# Patient Record
Sex: Male | Born: 1945 | Race: Black or African American | Hispanic: No | Marital: Married | Smoking: Never smoker
Health system: Southern US, Community
[De-identification: ages and names within clinical notes are randomized; demographics above are authoritative.]

## PROBLEM LIST (undated history)

## (undated) DIAGNOSIS — T7840XA Allergy, unspecified, initial encounter: Secondary | ICD-10-CM

## (undated) DIAGNOSIS — C61 Malignant neoplasm of prostate: Secondary | ICD-10-CM

## (undated) DIAGNOSIS — Z973 Presence of spectacles and contact lenses: Secondary | ICD-10-CM

## (undated) DIAGNOSIS — E785 Hyperlipidemia, unspecified: Secondary | ICD-10-CM

## (undated) DIAGNOSIS — I10 Essential (primary) hypertension: Secondary | ICD-10-CM

## (undated) DIAGNOSIS — E119 Type 2 diabetes mellitus without complications: Secondary | ICD-10-CM

## (undated) DIAGNOSIS — Z8614 Personal history of Methicillin resistant Staphylococcus aureus infection: Secondary | ICD-10-CM

## (undated) HISTORY — PX: PROSTATE BIOPSY: SHX241

## (undated) HISTORY — DX: Allergy, unspecified, initial encounter: T78.40XA

---

## 1969-03-14 HISTORY — PX: APPENDECTOMY: SHX54

## 1979-03-15 HISTORY — PX: ABDOMINAL EXPLORATION SURGERY: SHX538

## 2002-07-14 HISTORY — PX: COLONOSCOPY: SHX174

## 2002-07-25 ENCOUNTER — Ambulatory Visit (HOSPITAL_COMMUNITY): Admission: RE | Admit: 2002-07-25 | Discharge: 2002-07-25 | Payer: Self-pay | Admitting: Internal Medicine

## 2003-03-26 ENCOUNTER — Inpatient Hospital Stay (HOSPITAL_COMMUNITY): Admission: EM | Admit: 2003-03-26 | Discharge: 2003-03-29 | Payer: Self-pay | Admitting: Emergency Medicine

## 2004-05-10 ENCOUNTER — Emergency Department (HOSPITAL_COMMUNITY): Admission: EM | Admit: 2004-05-10 | Discharge: 2004-05-10 | Payer: Self-pay | Admitting: Emergency Medicine

## 2004-05-27 ENCOUNTER — Ambulatory Visit (HOSPITAL_COMMUNITY): Admission: RE | Admit: 2004-05-27 | Discharge: 2004-05-27 | Payer: Self-pay | Admitting: Pulmonary Disease

## 2004-05-30 ENCOUNTER — Ambulatory Visit: Payer: Self-pay | Admitting: Orthopedic Surgery

## 2004-07-04 ENCOUNTER — Ambulatory Visit: Payer: Self-pay | Admitting: Orthopedic Surgery

## 2005-04-03 ENCOUNTER — Ambulatory Visit: Payer: Self-pay | Admitting: Orthopedic Surgery
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
## 2012-05-27 DIAGNOSIS — E119 Type 2 diabetes mellitus without complications: Secondary | ICD-10-CM | POA: Diagnosis not present

## 2012-05-27 DIAGNOSIS — E785 Hyperlipidemia, unspecified: Secondary | ICD-10-CM | POA: Diagnosis not present

## 2012-05-27 DIAGNOSIS — Z125 Encounter for screening for malignant neoplasm of prostate: Secondary | ICD-10-CM | POA: Diagnosis not present

## 2012-05-27 DIAGNOSIS — I10 Essential (primary) hypertension: Secondary | ICD-10-CM | POA: Diagnosis not present

## 2012-06-02 DIAGNOSIS — I10 Essential (primary) hypertension: Secondary | ICD-10-CM | POA: Diagnosis not present

## 2012-06-02 DIAGNOSIS — Z0000 Encounter for general adult medical examination without abnormal findings: Secondary | ICD-10-CM | POA: Diagnosis not present

## 2012-06-02 DIAGNOSIS — E785 Hyperlipidemia, unspecified: Secondary | ICD-10-CM | POA: Diagnosis not present

## 2012-06-04 DIAGNOSIS — Z1212 Encounter for screening for malignant neoplasm of rectum: Secondary | ICD-10-CM | POA: Diagnosis not present

## 2012-09-28 DIAGNOSIS — E119 Type 2 diabetes mellitus without complications: Secondary | ICD-10-CM | POA: Diagnosis not present

## 2012-09-28 DIAGNOSIS — J301 Allergic rhinitis due to pollen: Secondary | ICD-10-CM | POA: Diagnosis not present

## 2012-09-28 DIAGNOSIS — E559 Vitamin D deficiency, unspecified: Secondary | ICD-10-CM | POA: Diagnosis not present

## 2012-09-28 DIAGNOSIS — I10 Essential (primary) hypertension: Secondary | ICD-10-CM | POA: Diagnosis not present

## 2012-09-28 DIAGNOSIS — E785 Hyperlipidemia, unspecified: Secondary | ICD-10-CM | POA: Diagnosis not present

## 2012-09-28 DIAGNOSIS — F4321 Adjustment disorder with depressed mood: Secondary | ICD-10-CM | POA: Diagnosis not present

## 2013-03-28 DIAGNOSIS — E1129 Type 2 diabetes mellitus with other diabetic kidney complication: Secondary | ICD-10-CM | POA: Diagnosis not present

## 2013-03-28 DIAGNOSIS — I10 Essential (primary) hypertension: Secondary | ICD-10-CM | POA: Diagnosis not present

## 2013-03-28 DIAGNOSIS — Z6825 Body mass index (BMI) 25.0-25.9, adult: Secondary | ICD-10-CM | POA: Diagnosis not present

## 2013-03-28 DIAGNOSIS — F4321 Adjustment disorder with depressed mood: Secondary | ICD-10-CM | POA: Diagnosis not present

## 2013-03-28 DIAGNOSIS — E785 Hyperlipidemia, unspecified: Secondary | ICD-10-CM | POA: Diagnosis not present

## 2013-03-28 DIAGNOSIS — Z23 Encounter for immunization: Secondary | ICD-10-CM | POA: Diagnosis not present

## 2013-07-21 DIAGNOSIS — E559 Vitamin D deficiency, unspecified: Secondary | ICD-10-CM | POA: Diagnosis not present

## 2013-07-21 DIAGNOSIS — E1169 Type 2 diabetes mellitus with other specified complication: Secondary | ICD-10-CM | POA: Diagnosis not present

## 2013-07-21 DIAGNOSIS — E785 Hyperlipidemia, unspecified: Secondary | ICD-10-CM | POA: Diagnosis not present

## 2013-07-21 DIAGNOSIS — Z125 Encounter for screening for malignant neoplasm of prostate: Secondary | ICD-10-CM | POA: Diagnosis not present

## 2013-07-28 DIAGNOSIS — R972 Elevated prostate specific antigen [PSA]: Secondary | ICD-10-CM | POA: Diagnosis not present

## 2013-07-28 DIAGNOSIS — Z0000 Encounter for general adult medical examination without abnormal findings: Secondary | ICD-10-CM | POA: Diagnosis not present

## 2013-07-28 DIAGNOSIS — E785 Hyperlipidemia, unspecified: Secondary | ICD-10-CM | POA: Diagnosis not present

## 2013-07-28 DIAGNOSIS — I10 Essential (primary) hypertension: Secondary | ICD-10-CM | POA: Diagnosis not present

## 2013-07-28 DIAGNOSIS — E1129 Type 2 diabetes mellitus with other diabetic kidney complication: Secondary | ICD-10-CM | POA: Diagnosis not present

## 2013-07-28 DIAGNOSIS — Z23 Encounter for immunization: Secondary | ICD-10-CM | POA: Diagnosis not present

## 2013-07-28 DIAGNOSIS — N183 Chronic kidney disease, stage 3 unspecified: Secondary | ICD-10-CM | POA: Diagnosis not present

## 2013-07-28 DIAGNOSIS — E559 Vitamin D deficiency, unspecified: Secondary | ICD-10-CM | POA: Diagnosis not present

## 2013-07-28 DIAGNOSIS — Z125 Encounter for screening for malignant neoplasm of prostate: Secondary | ICD-10-CM | POA: Diagnosis not present

## 2013-07-28 DIAGNOSIS — Z1331 Encounter for screening for depression: Secondary | ICD-10-CM | POA: Diagnosis not present

## 2013-08-09 DIAGNOSIS — Z1212 Encounter for screening for malignant neoplasm of rectum: Secondary | ICD-10-CM | POA: Diagnosis not present

## 2013-10-27 DIAGNOSIS — IMO0001 Reserved for inherently not codable concepts without codable children: Secondary | ICD-10-CM | POA: Diagnosis not present

## 2013-10-27 DIAGNOSIS — E785 Hyperlipidemia, unspecified: Secondary | ICD-10-CM | POA: Diagnosis not present

## 2013-10-27 DIAGNOSIS — Z6825 Body mass index (BMI) 25.0-25.9, adult: Secondary | ICD-10-CM | POA: Diagnosis not present

## 2013-10-27 DIAGNOSIS — I10 Essential (primary) hypertension: Secondary | ICD-10-CM | POA: Diagnosis not present

## 2013-10-27 DIAGNOSIS — R972 Elevated prostate specific antigen [PSA]: Secondary | ICD-10-CM | POA: Diagnosis not present

## 2013-10-27 DIAGNOSIS — E559 Vitamin D deficiency, unspecified: Secondary | ICD-10-CM | POA: Diagnosis not present

## 2013-10-27 DIAGNOSIS — N183 Chronic kidney disease, stage 3 unspecified: Secondary | ICD-10-CM | POA: Diagnosis not present

## 2013-11-22 ENCOUNTER — Ambulatory Visit (INDEPENDENT_AMBULATORY_CARE_PROVIDER_SITE_OTHER): Payer: Medicare Other | Admitting: Urology

## 2013-11-22 DIAGNOSIS — R972 Elevated prostate specific antigen [PSA]: Secondary | ICD-10-CM

## 2013-11-25 ENCOUNTER — Other Ambulatory Visit: Payer: Self-pay | Admitting: Urology

## 2013-11-25 DIAGNOSIS — R972 Elevated prostate specific antigen [PSA]: Secondary | ICD-10-CM

## 2013-12-20 ENCOUNTER — Ambulatory Visit (HOSPITAL_COMMUNITY)
Admission: RE | Admit: 2013-12-20 | Discharge: 2013-12-20 | Disposition: A | Discharge status: Home / Self Care | Payer: Medicare Other | Source: Ambulatory Visit | Attending: Urology | Admitting: Urology

## 2013-12-20 ENCOUNTER — Encounter (HOSPITAL_COMMUNITY): Payer: Self-pay

## 2013-12-20 ENCOUNTER — Other Ambulatory Visit: Payer: Self-pay | Admitting: Urology

## 2013-12-20 VITALS — BP 140/83 | HR 95 | Resp 16

## 2013-12-20 DIAGNOSIS — R972 Elevated prostate specific antigen [PSA]: Secondary | ICD-10-CM | POA: Diagnosis not present

## 2013-12-20 DIAGNOSIS — D075 Carcinoma in situ of prostate: Secondary | ICD-10-CM | POA: Diagnosis not present

## 2013-12-20 DIAGNOSIS — C61 Malignant neoplasm of prostate: Secondary | ICD-10-CM | POA: Diagnosis not present

## 2013-12-20 MED ORDER — LIDOCAINE HCL (PF) 2 % IJ SOLN
10.0000 mL | Freq: Once | INTRAMUSCULAR | Status: AC
  Administered 2013-12-20: 10 mL

## 2013-12-20 MED ORDER — GENTAMICIN SULFATE 40 MG/ML IJ SOLN
INTRAMUSCULAR | Status: AC
  Filled 2013-12-20: qty 4

## 2013-12-20 MED ORDER — LIDOCAINE HCL (PF) 2 % IJ SOLN
INTRAMUSCULAR | Status: AC
  Administered 2013-12-20: 10 mL
  Filled 2013-12-20: qty 10

## 2013-12-20 MED ORDER — GENTAMICIN SULFATE 40 MG/ML IJ SOLN
160.0000 mg | Freq: Once | INTRAMUSCULAR | Status: AC
  Administered 2013-12-20: 160 mg via INTRAMUSCULAR
  Filled 2013-12-20: qty 4

## 2013-12-20 NOTE — Progress Notes (Signed)
Prostate biopsy complete no signs of distress.  

## 2013-12-20 NOTE — Discharge Instructions (Signed)
Prostate Biopsy TRUS Biopsy BEFORE THE TEST   Do not take aspirin. Do not take any medicine that has aspirin in it 7 days before your biopsy.  You may be given a medicine to take on the day of your biopsy.  You may also be given a medicine or treatment to help you go poop (laxative or enema). AFTER THE TEST  Only take medicine as told by your doctor.  It is normal to have some bleeding from your rectum for the first 5 days.  You may have blood in your pee (urine) or sperm. Finding out the results of your test Ask when your test results will be ready. Make sure you get your test results. GET HELP RIGHT AWAY IF:  You have a temperature by mouth above 102 F (38.9 C), not controlled by medicine.  You have blood in your pee for more than 5 days.  You have a lot of blood in your pee.  You have bleeding from your rectum for more than 5 days or have a lot of blood in your poop (feces).  You have severe pain. Document Released: 06/18/2009 Document Revised: 09/22/2011 Document Reviewed: 02/16/2013 [facility] Patient Information 2014 [place], [place].

## 2013-12-27 ENCOUNTER — Encounter: Payer: Self-pay | Admitting: Urology

## 2014-01-23 DIAGNOSIS — C61 Malignant neoplasm of prostate: Secondary | ICD-10-CM | POA: Diagnosis not present

## 2014-02-07 ENCOUNTER — Encounter: Payer: Self-pay | Admitting: Radiation Oncology

## 2014-02-07 NOTE — Progress Notes (Signed)
GU Location of Tumor / Histology: prostate  If Prostate Cancer, Gleason Score is (3 + 3) and PSA is (7.68 on 11/22/13) 07/21/13 PSA 4.717  Miguel Powers presented 1 months ago with signs/symptoms of:   Biopsies of  prostate(if applicable) revealed:  0000000  Volume 30 mL    Past/Anticipated interventions by urology, if any: PSA surveillance  Past/Anticipated interventions by medical oncology, if any: no  Weight changes, if any: no  Bowel/Bladder complaints, if any:  IPSS 9, frequency, nocturia x 4  Nausea/Vomiting, if any: no  Pain issues, if any:  no  SAFETY ISSUES:  Prior radiation? no  Pacemaker/ICD? no  Possible current pregnancy? na  Is the patient on methotrexate? 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

## 2014-02-09 ENCOUNTER — Ambulatory Visit
Admission: RE | Admit: 2014-02-09 | Discharge: 2014-02-09 | Disposition: A | Discharge status: Home / Self Care | Payer: Medicare Other | Source: Ambulatory Visit | Attending: Radiation Oncology | Admitting: Radiation Oncology

## 2014-02-09 ENCOUNTER — Encounter: Payer: Self-pay | Admitting: Radiation Oncology

## 2014-02-09 VITALS — BP 129/68 | HR 102 | Temp 98.3°F | Resp 20 | Ht 71.0 in | Wt 190.4 lb

## 2014-02-09 DIAGNOSIS — N529 Male erectile dysfunction, unspecified: Secondary | ICD-10-CM | POA: Insufficient documentation

## 2014-02-09 DIAGNOSIS — C61 Malignant neoplasm of prostate: Secondary | ICD-10-CM | POA: Insufficient documentation

## 2014-02-09 DIAGNOSIS — Z510 Encounter for antineoplastic radiation therapy: Secondary | ICD-10-CM | POA: Insufficient documentation

## 2014-02-09 HISTORY — DX: Essential (primary) hypertension: I10

## 2014-02-09 HISTORY — DX: Hyperlipidemia, unspecified: E78.5

## 2014-02-09 HISTORY — DX: Malignant neoplasm of prostate: C61

## 2014-02-09 NOTE — Progress Notes (Signed)
[facility] NEW PATIENT EVALUATION  Name: Miguel Powers MRN: [id number]  Date:   02/09/2014           DOB: 03/07/1946  Status: outpatient   CC: Dr. Velna Hatchet,  Dahlstedt, Lillette Boxer, MD    REFERRING PHYSICIAN: Dahlstedt, Lillette Boxer, MD   DIAGNOSIS:  Stage TI C. favorable risk adenocarcinoma prostate  HISTORY OF PRESENT ILLNESS:  Miguel Powers is a 68 y.o. male who is seen today through the courtesy of Dr. Diona Fanti for discussion of possible radiation therapy in the management of his stage TI C. favorable risk adenocarcinoma prostate. He was noted by Dr.Holwerda to have an elevated PSA of 4.7 back in January 2015. This was repeated and found to be 7.68 on 11/22/2013 . He was seen by Dr. Diona Fanti and underwent ultrasound-guided biopsies on 12/20/2013. His was found to have Gleason 6 (3+3) involving 60% of one core from the right lateral mid gland, 40% of one core from right lateral apex, 5% of one core from the right apex, 50% of one core from left lateral mid gland, less than 5% of one core from the left mid gland and less than 5% of one core from the left apex. His gland volume was approximately 30 cc. He is doing reasonably well from a GU and GI standpoint. His I PSS score is 9. He does have erectile dysfunction, but can occasionally have intercourse without Viagra or Cialis. He is interested in seed implantation.  PREVIOUS RADIATION THERAPY: No   PAST MEDICAL HISTORY:  has a past medical history of Diabetes mellitus without complication; Prostate cancer (12/20/13); Hypertension; Hyperlipidemia; and MRSA infection (1990's).     PAST SURGICAL HISTORY:  Past Surgical History  Procedure Laterality Date  . Prostate biopsy    . Appendectomy      years ago     FAMILY HISTORY: family history includes Alzheimer's disease in his mother; Cancer in his mother; Diabetes in his mother; Heart disease in his father. His father died from complications of  Alzheimer's disease at 12. His mother died from lung cancer 43. No family history of prostate cancer.   SOCIAL HISTORY:  reports that he has never smoked. He does not have any smokeless tobacco history on file. He reports that he does not drink alcohol or use illicit drugs. Widowed for the past 2 years, 3 children. 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   ALLERGIES: Review of patient's allergies indicates no known allergies.   MEDICATIONS:  Current Outpatient Prescriptions  Medication Sig Dispense Refill  . AMLODIPINE BESYLATE PO Take 10 mg by mouth.      Marland Kitchen amoxicillin (AMOXIL) 250 MG capsule Take 250 mg by mouth 3 (three) times daily.      Marland Kitchen aspirin 81 MG tablet Take 81 mg by mouth daily.      . Cholecalciferol 1000 UNIT/10ML LIQD Take by mouth.      . furosemide (LASIX) 20 MG tablet Take 20 mg by mouth.      Marland Kitchen glipiZIDE (GLUCOTROL) 10 MG tablet Take 10 mg by mouth daily before breakfast.      . insulin NPH-regular Human (NOVOLIN 70/30) (70-30) 100 UNIT/ML injection Inject 8 Units into the skin 2 (two) times daily with a meal.      . lisinopril (PRINIVIL,ZESTRIL) 2.5 MG tablet Take 2.5 mg by mouth daily.      . niacin 250 MG CR capsule Take 250 mg by mouth at bedtime.      Marland Kitchen  rosuvastatin (CRESTOR) 5 MG tablet Take 5 mg by mouth daily.       No current facility-administered medications for this encounter.     REVIEW OF SYSTEMS:  Pertinent items are noted in HPI.    PHYSICAL EXAM:  height is 5\' 11"  (1.803 m) and weight is 190 lb 6.4 oz (86.365 kg). His temperature is 98.3 F (36.8 C). His blood pressure is 129/68 and his pulse is 102. His respiration is 20.   Alert and oriented 68 year old African American male appearing younger than stated age. Head and neck examination: Grossly unremarkable. Nodes: Without palpable cervical or supraclavicular lymphadenopathy. Chest: Lungs clear. Back: Without spinal or CVA tenderness. Abdomen: Left lower abdominal scar, without  masses organomegaly. Genitalia: Unremarkable to inspection. Rectal: The prostate gland is normal in size and is without focal induration or nodularity. Extremities: Without edema.   LABORATORY DATA:  No results found for this basename: WBC, HGB, HCT, MCV, PLT   No results found for this basename: NA, K, CL, CO2   No results found for this basename: ALT, AST, GGT, ALKPHOS, BILITOT    The PSA 7.684 11/22/2013    IMPRESSION: Stage TI C. favorable risk adenocarcinoma prostate. I explained to Mr. Doug Sou and his significant other, Ivin Booty, that his prognosis is related to his stage, Gleason score, and PSA level. All are favorable. Management options include surgery versus close observation versus radiation therapy. Radiation therapy options include seed implantation alone or 8 weeks of external beam/IMRT. We discussed the potential acute and late toxicities of radiation therapy. We also discussed radiation safety issues. He was given literature for review. After a lengthy discussion he is most interested in seed implantation which I think would be a good choice for him. We will go ahead and get a CT arch study today and then get him scheduled for seed implantation with Dr. Diona Fanti in 6-8 weeks. Consent is signed today.   PLAN: As discussed above.  I spent 45  minutes face to face with the patient and more than 50% of that time was spent in counseling and/or coordination of care.

## 2014-02-09 NOTE — Progress Notes (Addendum)
CC: Dr. Franchot Gallo  Complex simulation/treatment planning note: The patient was taken to the CT simulator. His pelvis was scanned. The CT data set was sent to the planning system were contoured his prostate. His prostate volume is 35.1 cc. Dr. Diona Fanti measured 30 cc. The prostate was projected over the bony arch and arch is open. He is a candidate for seed implantation. I am prescribing 14,500 cGy utilizing I-125 seeds. He'll be implanted with the [employer] system with Dr. Diona Fanti.

## 2014-02-09 NOTE — Progress Notes (Signed)
Please see the Nurse Progress Note in the MD Initial Consult Encounter for this patient. 

## 2014-02-14 ENCOUNTER — Other Ambulatory Visit: Payer: Self-pay | Admitting: Urology

## 2014-02-14 ENCOUNTER — Telehealth: Payer: Self-pay | Admitting: *Deleted

## 2014-02-14 NOTE — Telephone Encounter (Signed)
CALLED PATIENT TO INFORM OF IMPLANT DATE, LVM FOR A RETURN CALL 

## 2014-02-16 ENCOUNTER — Encounter (HOSPITAL_BASED_OUTPATIENT_CLINIC_OR_DEPARTMENT_OTHER)
Admission: RE | Admit: 2014-02-16 | Discharge: 2014-02-16 | Disposition: A | Discharge status: Home / Self Care | Payer: Medicare Other | Source: Ambulatory Visit | Attending: Urology | Admitting: Urology

## 2014-02-16 ENCOUNTER — Ambulatory Visit (HOSPITAL_BASED_OUTPATIENT_CLINIC_OR_DEPARTMENT_OTHER)
Admission: RE | Admit: 2014-02-16 | Discharge: 2014-02-16 | Disposition: A | Discharge status: Home / Self Care | Payer: Medicare Other | Source: Ambulatory Visit | Attending: Urology | Admitting: Urology

## 2014-02-16 ENCOUNTER — Other Ambulatory Visit: Payer: Self-pay | Admitting: Urology

## 2014-02-16 DIAGNOSIS — Z01818 Encounter for other preprocedural examination: Secondary | ICD-10-CM | POA: Insufficient documentation

## 2014-02-16 DIAGNOSIS — C61 Malignant neoplasm of prostate: Secondary | ICD-10-CM | POA: Insufficient documentation

## 2014-02-16 DIAGNOSIS — Z1810 Retained metal fragments, unspecified: Secondary | ICD-10-CM | POA: Insufficient documentation

## 2014-02-16 DIAGNOSIS — Z01810 Encounter for preprocedural cardiovascular examination: Secondary | ICD-10-CM | POA: Diagnosis not present

## 2014-02-16 DIAGNOSIS — Z8546 Personal history of malignant neoplasm of prostate: Secondary | ICD-10-CM | POA: Diagnosis not present

## 2014-03-07 ENCOUNTER — Telehealth: Payer: Self-pay | Admitting: *Deleted

## 2014-03-07 NOTE — Telephone Encounter (Signed)
Called patient to inform that implant has been moved from 04-06-14 to 04-13-14, spoke with patient and he is aware of the implant has been moved to 04-13-14.

## 2014-03-14 DIAGNOSIS — L03039 Cellulitis of unspecified toe: Secondary | ICD-10-CM | POA: Diagnosis not present

## 2014-03-14 DIAGNOSIS — M79609 Pain in unspecified limb: Secondary | ICD-10-CM | POA: Diagnosis not present

## 2014-03-21 DIAGNOSIS — M79609 Pain in unspecified limb: Secondary | ICD-10-CM | POA: Diagnosis not present

## 2014-03-21 DIAGNOSIS — B351 Tinea unguium: Secondary | ICD-10-CM | POA: Diagnosis not present

## 2014-04-06 ENCOUNTER — Encounter (HOSPITAL_BASED_OUTPATIENT_CLINIC_OR_DEPARTMENT_OTHER): Payer: Self-pay | Admitting: *Deleted

## 2014-04-07 ENCOUNTER — Encounter (HOSPITAL_BASED_OUTPATIENT_CLINIC_OR_DEPARTMENT_OTHER): Payer: Self-pay | Admitting: *Deleted

## 2014-04-07 LAB — COMPREHENSIVE METABOLIC PANEL
ALT: 31 U/L (ref 0–53)
ANION GAP: 15 (ref 5–15)
AST: 29 U/L (ref 0–37)
Albumin: 4.4 g/dL (ref 3.5–5.2)
Alkaline Phosphatase: 80 U/L (ref 39–117)
BILIRUBIN TOTAL: 1 mg/dL (ref 0.3–1.2)
BUN: 29 mg/dL — AB (ref 6–23)
CO2: 24 mEq/L (ref 19–32)
Calcium: 9.9 mg/dL (ref 8.4–10.5)
Chloride: 99 mEq/L (ref 96–112)
Creatinine, Ser: 1.75 mg/dL — ABNORMAL HIGH (ref 0.50–1.35)
GFR calc non Af Amer: 39 mL/min — ABNORMAL LOW (ref >90)
GFR, EST AFRICAN AMERICAN: 45 mL/min — AB (ref >90)
GLUCOSE: 223 mg/dL — AB (ref 70–99)
Potassium: 4.1 mEq/L (ref 3.7–5.3)
Sodium: 138 mEq/L (ref 137–147)
TOTAL PROTEIN: 8.8 g/dL — AB (ref 6.0–8.3)

## 2014-04-07 LAB — CBC
HEMATOCRIT: 38.8 % — AB (ref 39.0–52.0)
HEMOGLOBIN: 12.9 g/dL — AB (ref 13.0–17.0)
MCH: 30.9 pg (ref 26.0–34.0)
MCHC: 33.2 g/dL (ref 30.0–36.0)
MCV: 92.8 fL (ref 78.0–100.0)
Platelets: 210 10*3/uL (ref 150–400)
RBC: 4.18 MIL/uL — ABNORMAL LOW (ref 4.22–5.81)
RDW: 11.9 % (ref 11.5–15.5)
WBC: 8 10*3/uL (ref 4.0–10.5)

## 2014-04-07 LAB — PROTIME-INR
INR: 1.03 (ref 0.00–1.49)
Prothrombin Time: 13.5 seconds (ref 11.6–15.2)

## 2014-04-07 LAB — APTT: APTT: 24 s (ref 24–37)

## 2014-04-07 NOTE — Progress Notes (Signed)
NPO AFTER MN. ARRIVE AT 1100. CURRENT LAB RESULTS, EKG, AND CXR IN CHART AND EPIC. WILL TAKE NORVASC AM DOS W/ SIPS OF WATER AND DO FLEET ENEMA.

## 2014-04-11 DIAGNOSIS — C61 Malignant neoplasm of prostate: Secondary | ICD-10-CM | POA: Diagnosis not present

## 2014-04-12 ENCOUNTER — Telehealth: Payer: Self-pay | Admitting: *Deleted

## 2014-04-12 NOTE — Telephone Encounter (Signed)
Called patient to remind of procedure for 04-13-14, lvm for a return call

## 2014-04-12 NOTE — Anesthesia Preprocedure Evaluation (Addendum)
Anesthesia Evaluation  Patient identified by MRN, date of birth, ID band Patient awake    Reviewed: Allergy & Precautions, H&P , NPO status , Patient's Chart, lab work & pertinent test results  History of Anesthesia Complications Negative for: history of anesthetic complications  Airway Mallampati: II TM Distance: >3 FB Neck ROM: Full    Dental no notable dental hx.    Pulmonary neg pulmonary ROS,  breath sounds clear to auscultation  Pulmonary exam normal       Cardiovascular Exercise Tolerance: Good hypertension, Pt. on medications Rhythm:Regular Rate:Normal     Neuro/Psych negative neurological ROS  negative psych ROS   GI/Hepatic negative GI ROS, Neg liver ROS,   Endo/Other  negative endocrine ROSdiabetes, Poorly Controlled, Type 2, Oral Hypoglycemic Agents, Insulin Dependent  Renal/GU negative Renal ROS  negative genitourinary   Musculoskeletal negative musculoskeletal ROS (+)   Abdominal   Peds negative pediatric ROS (+)  Hematology negative hematology ROS (+)   Anesthesia Other Findings   Reproductive/Obstetrics negative OB ROS                          Anesthesia Physical Anesthesia Plan  ASA: III  Anesthesia Plan: General   Post-op Pain Management:    Induction: Intravenous  Airway Management Planned: LMA  Additional Equipment:   Intra-op Plan:   Post-operative Plan: Extubation in OR  Informed Consent: I have reviewed the patients History and Physical, chart, labs and discussed the procedure including the risks, benefits and alternatives for the proposed anesthesia with the patient or authorized representative who has indicated his/her understanding and acceptance.   Dental advisory given  Plan Discussed with: CRNA  Anesthesia Plan Comments:        Anesthesia Quick Evaluation

## 2014-04-12 NOTE — H&P (Signed)
  Urology History and Physical Exam  CC: Prostate cancer  HPI: 68 year old male presents for brachytherapy.  He underwent ultrasound and biopsy of his prostate on/03/2014. At that time, PSA was 7.68. Prostatic volume was 30 mL. 6/12 cores revealed adenocarcinoma, all revealed Gleason 3+3 pattern. Perineural invasion was present.  He consulted with Dr. Valere Dross and has chosen to have brachytherapy.    PMH: Past Medical History  Diagnosis Date  . Hypertension   . Hyperlipidemia   . History of MRSA infection     1990's--  left abd.  . Type 2 diabetes mellitus   . Prostate cancer      Gleason 6,  cT1C  . Wears glasses     PSH: Past Surgical History  Procedure Laterality Date  . Prostate biopsy    . Appendectomy  1970's  . Abdominal exploration surgery  1980's    resection abscess and irrigation    Allergies: No Known Allergies  Medications: No prescriptions prior to admission     Social History: History   Social History  . Marital Status: Widowed    Spouse Name: N/A    Number of Children: N/A  . Years of Education: N/A   Occupational History  . Not on file.   Social History Main Topics  . Smoking status: Never Smoker   . Smokeless tobacco: Never Used  . Alcohol Use: No  . Drug Use: No  . Sexual Activity: Not on file   Other Topics Concern  . Not on file   Social History Narrative  . No narrative on file    Family History: Family History  Problem Relation Age of Onset  . Diabetes Mother   . Cancer Mother     lung  . Alzheimer's disease Mother   . Heart disease Father     Review of Systems: Positive: Mild LUTS--IPSS score 9 Negative:  A further 10 point review of systems was negative except what is listed in the HPI.                  Physical Exam: @VITALS2 @ General: No acute distress.  Awake. Head:  Normocephalic.  Atraumatic. ENT:  EOMI.  Mucous membranes moist Neck:  Supple.  No lymphadenopathy. CV:  S1 present. S2 present. Regular  rate. Pulmonary: Equal effort bilaterally.  Clear to auscultation bilaterally. Abdomen: Soft.  Nontender to palpation. Skin:  Normal turgor.  No visible rash. Extremity: No gross deformity of bilateral upper extremities.  No gross deformity of                             lower extremities. Neurologic: Alert. Appropriate mood.    Studies:  No results found for this basename: HGB, WBC, PLT,  in the last 72 hours  No results found for this basename: NA, K, CL, CO2, BUN, CREATININE, CALCIUM, MAGNESIUM, GFRNONAA, GFRAA,  in the last 72 hours   No results found for this basename: PT, INR, APTT,  in the last 72 hours   No components found with this basename: ABG,     Assessment:  Stage T1c Pca  Plan: I 125 brachytherapy

## 2014-04-13 ENCOUNTER — Ambulatory Visit (HOSPITAL_BASED_OUTPATIENT_CLINIC_OR_DEPARTMENT_OTHER): Payer: Medicare Other | Admitting: Anesthesiology

## 2014-04-13 ENCOUNTER — Encounter: Payer: Self-pay | Admitting: Radiation Oncology

## 2014-04-13 ENCOUNTER — Ambulatory Visit (HOSPITAL_BASED_OUTPATIENT_CLINIC_OR_DEPARTMENT_OTHER)
Admission: RE | Admit: 2014-04-13 | Discharge: 2014-04-13 | Disposition: A | Discharge status: Home / Self Care | Payer: Medicare Other | Source: Ambulatory Visit | Attending: Urology | Admitting: Urology

## 2014-04-13 ENCOUNTER — Ambulatory Visit (HOSPITAL_COMMUNITY): Payer: Medicare Other

## 2014-04-13 ENCOUNTER — Encounter (HOSPITAL_BASED_OUTPATIENT_CLINIC_OR_DEPARTMENT_OTHER): Payer: Medicare Other | Admitting: Anesthesiology

## 2014-04-13 ENCOUNTER — Encounter (HOSPITAL_BASED_OUTPATIENT_CLINIC_OR_DEPARTMENT_OTHER): Payer: Self-pay | Admitting: *Deleted

## 2014-04-13 ENCOUNTER — Encounter (HOSPITAL_BASED_OUTPATIENT_CLINIC_OR_DEPARTMENT_OTHER)
Admission: RE | Disposition: A | Discharge status: Home / Self Care | Payer: Self-pay | Source: Ambulatory Visit | Attending: Urology

## 2014-04-13 DIAGNOSIS — E119 Type 2 diabetes mellitus without complications: Secondary | ICD-10-CM | POA: Insufficient documentation

## 2014-04-13 DIAGNOSIS — I10 Essential (primary) hypertension: Secondary | ICD-10-CM | POA: Diagnosis not present

## 2014-04-13 DIAGNOSIS — E785 Hyperlipidemia, unspecified: Secondary | ICD-10-CM | POA: Diagnosis not present

## 2014-04-13 DIAGNOSIS — C61 Malignant neoplasm of prostate: Secondary | ICD-10-CM | POA: Insufficient documentation

## 2014-04-13 HISTORY — DX: Presence of spectacles and contact lenses: Z97.3

## 2014-04-13 HISTORY — PX: RADIOACTIVE SEED IMPLANT: SHX5150

## 2014-04-13 HISTORY — DX: Personal history of Methicillin resistant Staphylococcus aureus infection: Z86.14

## 2014-04-13 HISTORY — DX: Type 2 diabetes mellitus without complications: E11.9

## 2014-04-13 LAB — POCT I-STAT, CHEM 8
BUN: 23 mg/dL (ref 6–23)
CREATININE: 1.7 mg/dL — AB (ref 0.50–1.35)
Calcium, Ion: 1.3 mmol/L (ref 1.13–1.30)
Chloride: 105 mEq/L (ref 96–112)
Glucose, Bld: 342 mg/dL — ABNORMAL HIGH (ref 70–99)
HEMATOCRIT: 40 % (ref 39.0–52.0)
Hemoglobin: 13.6 g/dL (ref 13.0–17.0)
Potassium: 4 mEq/L (ref 3.7–5.3)
SODIUM: 139 meq/L (ref 137–147)
TCO2: 23 mmol/L (ref 0–100)

## 2014-04-13 LAB — GLUCOSE, CAPILLARY: GLUCOSE-CAPILLARY: 210 mg/dL — AB (ref 70–99)

## 2014-04-13 SURGERY — INSERTION, RADIATION SOURCE, PROSTATE
Anesthesia: General | Site: Prostate

## 2014-04-13 MED ORDER — OXYCODONE HCL 5 MG PO TABS
5.0000 mg | ORAL_TABLET | ORAL | Status: DC | PRN
  Filled 2014-04-13: qty 2

## 2014-04-13 MED ORDER — HYDROCODONE-ACETAMINOPHEN 5-325 MG PO TABS: 1.0000 | ORAL_TABLET | ORAL | Status: DC | PRN

## 2014-04-13 MED ORDER — ACETAMINOPHEN 325 MG PO TABS
650.0000 mg | ORAL_TABLET | ORAL | Status: DC | PRN
  Filled 2014-04-13: qty 2

## 2014-04-13 MED ORDER — BELLADONNA ALKALOIDS-OPIUM 16.2-60 MG RE SUPP
RECTAL | Status: AC
  Filled 2014-04-13: qty 1

## 2014-04-13 MED ORDER — PROPOFOL 10 MG/ML IV BOLUS
INTRAVENOUS | Status: DC | PRN
  Administered 2014-04-13: 200 mg via INTRAVENOUS

## 2014-04-13 MED ORDER — CIPROFLOXACIN IN D5W 400 MG/200ML IV SOLN
INTRAVENOUS | Status: AC
  Filled 2014-04-13: qty 200

## 2014-04-13 MED ORDER — LACTATED RINGERS IV SOLN
INTRAVENOUS | Status: DC
  Administered 2014-04-13 (×2): via INTRAVENOUS
  Filled 2014-04-13: qty 1000

## 2014-04-13 MED ORDER — STERILE WATER FOR IRRIGATION IR SOLN
Status: DC | PRN
  Administered 2014-04-13: 3000 mL

## 2014-04-13 MED ORDER — SODIUM CHLORIDE 0.9 % IV SOLN
250.0000 mL | INTRAVENOUS | Status: DC | PRN
  Filled 2014-04-13: qty 250

## 2014-04-13 MED ORDER — ACETAMINOPHEN 650 MG RE SUPP
650.0000 mg | RECTAL | Status: DC | PRN
  Filled 2014-04-13: qty 1

## 2014-04-13 MED ORDER — MIDAZOLAM HCL 2 MG/2ML IJ SOLN
INTRAMUSCULAR | Status: AC
  Filled 2014-04-13: qty 2

## 2014-04-13 MED ORDER — IOHEXOL 350 MG/ML SOLN
INTRAVENOUS | Status: DC | PRN
  Administered 2014-04-13: 7 mL

## 2014-04-13 MED ORDER — MORPHINE SULFATE 2 MG/ML IJ SOLN
1.0000 mg | INTRAMUSCULAR | Status: DC | PRN
  Filled 2014-04-13: qty 1

## 2014-04-13 MED ORDER — FLEET ENEMA 7-19 GM/118ML RE ENEM
1.0000 | ENEMA | Freq: Once | RECTAL | Status: AC
  Administered 2014-04-13: 1 via RECTAL
  Filled 2014-04-13: qty 1

## 2014-04-13 MED ORDER — MIDAZOLAM HCL 5 MG/5ML IJ SOLN
INTRAMUSCULAR | Status: DC | PRN
  Administered 2014-04-13: 2 mg via INTRAVENOUS

## 2014-04-13 MED ORDER — CIPROFLOXACIN HCL 250 MG PO TABS
250.0000 mg | ORAL_TABLET | Freq: Two times a day (BID) | ORAL | Status: DC
Start: 2014-04-13 — End: 2014-05-04

## 2014-04-13 MED ORDER — PROMETHAZINE HCL 25 MG/ML IJ SOLN
6.2500 mg | INTRAMUSCULAR | Status: DC | PRN
  Filled 2014-04-13: qty 1

## 2014-04-13 MED ORDER — FENTANYL CITRATE 0.05 MG/ML IJ SOLN
INTRAMUSCULAR | Status: AC
  Filled 2014-04-13: qty 4

## 2014-04-13 MED ORDER — SODIUM CHLORIDE 0.9 % IJ SOLN
3.0000 mL | INTRAMUSCULAR | Status: DC | PRN
  Filled 2014-04-13: qty 3

## 2014-04-13 MED ORDER — ACETAMINOPHEN 10 MG/ML IV SOLN
INTRAVENOUS | Status: DC | PRN
  Administered 2014-04-13: 1000 mg via INTRAVENOUS

## 2014-04-13 MED ORDER — FENTANYL CITRATE 0.05 MG/ML IJ SOLN
25.0000 ug | INTRAMUSCULAR | Status: DC | PRN
  Filled 2014-04-13: qty 1

## 2014-04-13 MED ORDER — FENTANYL CITRATE 0.05 MG/ML IJ SOLN
INTRAMUSCULAR | Status: DC | PRN
  Administered 2014-04-13 (×2): 25 ug via INTRAVENOUS
  Administered 2014-04-13 (×2): 50 ug via INTRAVENOUS

## 2014-04-13 MED ORDER — SODIUM CHLORIDE 0.9 % IJ SOLN
3.0000 mL | Freq: Two times a day (BID) | INTRAMUSCULAR | Status: DC
  Filled 2014-04-13: qty 3

## 2014-04-13 MED ORDER — STERILE WATER FOR IRRIGATION IR SOLN
Status: DC | PRN
  Administered 2014-04-13: 500 mL

## 2014-04-13 MED ORDER — ONDANSETRON HCL 4 MG/2ML IJ SOLN
INTRAMUSCULAR | Status: DC | PRN
  Administered 2014-04-13: 4 mg via INTRAVENOUS

## 2014-04-13 MED ORDER — LIDOCAINE HCL (CARDIAC) 20 MG/ML IV SOLN
INTRAVENOUS | Status: DC | PRN
  Administered 2014-04-13: 80 mg via INTRAVENOUS

## 2014-04-13 MED ORDER — INSULIN ASPART 100 UNIT/ML ~~LOC~~ SOLN
8.0000 [IU] | Freq: Once | SUBCUTANEOUS | Status: AC
  Administered 2014-04-13: 8 [IU] via SUBCUTANEOUS
  Filled 2014-04-13: qty 0.08

## 2014-04-13 MED ORDER — CIPROFLOXACIN IN D5W 400 MG/200ML IV SOLN
400.0000 mg | INTRAVENOUS | Status: AC
  Administered 2014-04-13: 400 mg via INTRAVENOUS
  Filled 2014-04-13: qty 200

## 2014-04-13 SURGICAL SUPPLY — 27 items
BAG URINE DRAINAGE (UROLOGICAL SUPPLIES) ×3 IMPLANT
BLADE CLIPPER SURG (BLADE) ×3 IMPLANT
CATH FOLEY 2WAY SLVR  5CC 16FR (CATHETERS) ×2
CATH FOLEY 2WAY SLVR 5CC 16FR (CATHETERS) ×1 IMPLANT
CATH ROBINSON RED A/P 20FR (CATHETERS) ×3 IMPLANT
CLOTH BEACON ORANGE TIMEOUT ST (SAFETY) ×3 IMPLANT
COVER MAYO STAND STRL (DRAPES) ×3 IMPLANT
COVER TABLE BACK 60X90 (DRAPES) ×3 IMPLANT
DRSG TEGADERM 4X4.75 (GAUZE/BANDAGES/DRESSINGS) ×3 IMPLANT
DRSG TEGADERM 8X12 (GAUZE/BANDAGES/DRESSINGS) ×3 IMPLANT
GLOVE BIO SURGEON STRL SZ7.5 (GLOVE) ×10 IMPLANT
GLOVE BIO SURGEON STRL SZ8 (GLOVE) ×6 IMPLANT
GLOVE ECLIPSE 8.0 STRL XLNG CF (GLOVE) IMPLANT
GOWN PREVENTION PLUS LG XLONG (DISPOSABLE) ×1 IMPLANT
GOWN STRL REIN XL XLG (GOWN DISPOSABLE) ×1 IMPLANT
GOWN STRL REUS W/ TWL LRG LVL3 (GOWN DISPOSABLE) IMPLANT
GOWN STRL REUS W/ TWL XL LVL3 (GOWN DISPOSABLE) IMPLANT
GOWN STRL REUS W/TWL LRG LVL3 (GOWN DISPOSABLE) ×3
GOWN STRL REUS W/TWL XL LVL3 (GOWN DISPOSABLE) ×3
HOLDER FOLEY CATH W/STRAP (MISCELLANEOUS) ×3 IMPLANT
PACK CYSTO (CUSTOM PROCEDURE TRAY) ×3 IMPLANT
SPONGE GAUZE 4X4 12PLY STER LF (GAUZE/BANDAGES/DRESSINGS) ×2 IMPLANT
SYRINGE 10CC LL (SYRINGE) ×3 IMPLANT
UNDERPAD 30X30 INCONTINENT (UNDERPADS AND DIAPERS) ×6 IMPLANT
WATER STERILE IRR 3000ML UROMA (IV SOLUTION) ×3 IMPLANT
WATER STERILE IRR 500ML POUR (IV SOLUTION) ×3 IMPLANT
radioactive seed ×2 IMPLANT

## 2014-04-13 NOTE — Progress Notes (Signed)
CC: Dr. Franchot Gallo  End of treatment summary:  Diagnosis: Stage T1c favorable risk adenocarcinoma prostate  Requesting physician/urologist: Dr. Franchot Gallo  Intent: Curative  Seed implant date: 04/13/2014  Site/dose: Prostate 14,500 cGy, isotope I-125 implanting 65 seeds in 23 active needles. Individual seed activity 0.43 mCi per seed for a total implant activity of 20.0 mCi.  Narrative: The patient appears to have undergone a successful Nucletron seed Selectron implant with Dr. Diona Fanti.  Plan: Followup visit with both of [place] in approximately 3 weeks. He'll have a CT scan at that time to assess the quality of his implant.

## 2014-04-13 NOTE — Discharge Instructions (Signed)
Radioactive Seed Implant Home Care Instructions   Activity:    Rest for the remainder of the day.  Do not drive or operate equipment today.  You may resume normal activities in a few days as instructed by your physician, without risk of harmful radiation exposure to those around you, provided you follow the time and distance precautions on the Radiation Oncology Instruction Sheet.   Meals: Drink plenty of lipuids and eat light foods, such as gelatin or soup this evening .  You may return to normal meal plan tomorrow.  Return To Work: You may return to work as instructed by Naval architect.  Special Instruction:   If any seeds are found, use tweezers to pick up seeds and place in a glass container of any kind and bring to your physician's office.  Call your physician if any of these symptoms occur:   Persistent or heavy bleeding  Urine stream diminishes or stops completely after catheter is removed  Fever equal to or greater than 101 degrees F  Cloudy urine with a strong foul odor  Severe pain  You may feel some burning pain and/or hesitancy when you urinate after the catheter is removed.  These symptoms may increase over the next few weeks, but should diminish within forur to six weeks.  Applying moist heat to the lower abdomen or a hot tub bath may help relieve the pain.  If the discomfort becomes severe, please call your physician for additional medication Follow-up (Date of Return Visit to Physician):  Post Anesthesia Home Care Instructions  Activity: Get plenty of rest for the remainder of the day. A responsible adult should stay with you for 24 hours following the procedure.  For the next 24 hours, DO NOT: -Drive a car -Paediatric nurse -Drink alcoholic beverages -Take any medication unless instructed by your physician -Make any legal decisions or sign important papers.  Meals: Start with liquid foods such as gelatin or soup. Progress to regular foods as tolerated. Avoid  greasy, spicy, heavy foods. If nausea and/or vomiting occur, drink only clear liquids until the nausea and/or vomiting subsides. Call your physician if vomiting continues.  Special Instructions/Symptoms: Your throat may feel dry or sore from the anesthesia or the breathing tube placed in your throat during surgery. If this causes discomfort, gargle with warm salt water. The discomfort should disappear within 24 hours.   Patient:_______________________________   @DATE @  Nurse:________________________________ @DATE @

## 2014-04-13 NOTE — Op Note (Signed)
Preoperative diagnosis: Clinical stage TI C adenocarcinoma the prostate   Postoperative diagnosis: Same   Procedure: I-125 prostate seed implantation with Nucletron robotic implanter   Surgeon: Lillette Boxer. Roberta Kelly M.D.  Radiation Oncologist:Murray  Anesthesia: Gen.   Indications: Patient  was diagnosed with clinical stage TI C prostate cancer. We had extensive discussion with him about treatment options versus. He elected to proceed with seed implantation. He underwent consultation my office as well as with Dr. Valere Dross. He appeared to understand the advantages disadvantages potential risks of this treatment option. Full informed consent has been obtained.   Technique and findings: Patient was brought the operating room where he had successful induction of general anesthesia. He was placed in dorso-lithotomy position and prepped and draped in usual manner. Appropriate surgical timeout was performed. Radiation oncology department placed a transrectal ultrasound probe anchoring stand. Foley catheter with contrast in the balloon was inserted without difficulty. Anchoring needles were placed within the prostate. Rectal tube was placed. Real-time contouring of the urethra prostate and rectum were performed and the dosing parameters were established. Targeted dose was 145 gray.  I was then called  to the operating suite suite for placement of the needles. A second timeout was performed. All needle passage was done with real-time transrectal ultrasound guidance with the sagittal plane. A total of 23 needles were placed. The implantation itself was done with the robotic implanter. 65 active seeds were implanted. The Foley catheter was removed and flexible cystoscopy failed to show any seeds outside the prostate.  The patient was brought to recovery room in stable condition, having tolerated the procedure well.Marland Kitchen

## 2014-04-13 NOTE — Anesthesia Procedure Notes (Signed)
Procedure Name: LMA Insertion Date/Time: 04/13/2014 1:12 PM Performed by: Bethena Roys T Pre-anesthesia Checklist: Patient identified, Emergency Drugs available, Suction available and Patient being monitored Patient Re-evaluated:Patient Re-evaluated prior to inductionOxygen Delivery Method: Circle System Utilized Preoxygenation: Pre-oxygenation with 100% oxygen Intubation Type: IV induction Ventilation: Mask ventilation without difficulty LMA: LMA inserted LMA Size: 5.0 Number of attempts: 1 Airway Equipment and Method: bite block Placement Confirmation: positive ETCO2 Dental Injury: Teeth and Oropharynx as per pre-operative assessment

## 2014-04-13 NOTE — Transfer of Care (Signed)
Immediate Anesthesia Transfer of Care Note  Patient: Miguel Powers  Procedure(s) Performed: Procedure(s): RADIOACTIVE SEED IMPLANT (N/A)  Patient Location: PACU  Anesthesia Type:General  Level of Consciousness: sedated and responds to stimulation  Airway & Oxygen Therapy: Patient Spontanous Breathing and Patient connected to nasal cannula oxygen  Post-op Assessment: Report given to PACU RN  Post vital signs: Reviewed and stable  Complications: No apparent anesthesia complications

## 2014-04-13 NOTE — Interval H&P Note (Signed)
History and Physical Interval Note:  04/13/2014 12:36 PM  [name]  has presented today for surgery, with the diagnosis of Prostate Cancer  The various methods of treatment have been discussed with the patient and family. After consideration of risks, benefits and other options for treatment, the patient has consented to  Procedure(s): RADIOACTIVE SEED IMPLANT (N/A) as a surgical intervention .  The patient's history has been reviewed, patient examined, no change in status, stable for surgery.  I have reviewed the patient's chart and labs.  Questions were answered to the patient's satisfaction.     Miguel Powers

## 2014-04-13 NOTE — Progress Notes (Signed)
[facility] Brachytherapy Operative Procedure Note  Name: ALESANDER YADAV MRN: [id number]  Date:   02/14/2014           DOB: Dec 14, 1945  Status:outpatient    CC:Dr. Velna Hatchet , Dr. Franchot Gallo DIAGNOSIS: A 68 year old gentlemen with stage T1c adenocarcinoma of the prostate with a Gleason of 6 and a PSA of 7.68.  PROCEDURE: Insertion of radioactive I-125 seeds into the prostate gland.  RADIATION DOSE: 145 Gy, definitive therapy.  TECHNIQUE: YIAN SCHWEIKERT was brought to the operating room with Dr.  Diona Fanti. He was placed in the dorsolithotomy position. He was catheterized and a rectal tube was inserted. The perineum was shaved, prepped and draped. The ultrasound probe was then introduced into the rectum to see the prostate gland.  TREATMENT DEVICE: A needle grid was attached to the ultrasound probe stand and anchor needles were placed.  COMPLEX ISODOSE CALCULATION: The prostate was imaged in 3D using a sagittal sweep of the prostate probe. These images were transferred to the planning computer. There, the prostate, urethra and rectum were defined on each axial reconstructed image. Then, the software created an optimized plan and a few seed positions were adjusted. Then the accepted plan was uploaded to the seed Selectron afterloading unit.  SPECIAL TREATMENT PROCEDURE/SUPERVISION AND HANDLING: The Nucletron FIRST system was used to place the needles under sagittal guidance. A total of 23 needles were used to deposit 65 seeds in the prostate gland. The individual seed activity was 0.43 mCi for a total implant activity of 28.0 mCi.  COMPLEX SIMULATION: At the end of the procedure, an anterior radiograph of the pelvis was obtained to document seed positioning and count. Cystoscopy was performed to check the urethra and bladder.  MICRODOSIMETRY: At the end of the procedure, the patient was emitting 0.06 mrem/hr at 1 meter. Accordingly, he was  considered safe for hospital discharge.  PLAN: The patient will return to the radiation oncology clinic for post implant CT dosimetry in three weeks.

## 2014-04-13 NOTE — Anesthesia Postprocedure Evaluation (Signed)
  Anesthesia Post-op Note  Patient: Miguel Powers  Procedure(s) Performed: Procedure(s) (LRB): RADIOACTIVE SEED IMPLANT (N/A)  Patient Location: PACU  Anesthesia Type: General  Level of Consciousness: awake and alert   Airway and Oxygen Therapy: Patient Spontanous Breathing  Post-op Pain: mild  Post-op Assessment: Post-op Vital signs reviewed, Patient's Cardiovascular Status Stable, Respiratory Function Stable, Patent Airway and No signs of Nausea or vomiting  Last Vitals:  Filed Vitals:   04/13/14 1615  BP: 131/70  Pulse: 94  Temp:   Resp: 11    Post-op Vital Signs: stable   Complications: No apparent anesthesia complications

## 2014-04-14 ENCOUNTER — Encounter (HOSPITAL_BASED_OUTPATIENT_CLINIC_OR_DEPARTMENT_OTHER): Payer: Self-pay | Admitting: Urology

## 2014-04-25 DIAGNOSIS — M79609 Pain in unspecified limb: Secondary | ICD-10-CM | POA: Diagnosis not present

## 2014-04-25 DIAGNOSIS — B351 Tinea unguium: Secondary | ICD-10-CM | POA: Diagnosis not present

## 2014-05-02 ENCOUNTER — Encounter: Payer: Self-pay | Admitting: *Deleted

## 2014-05-03 ENCOUNTER — Telehealth: Payer: Self-pay | Admitting: *Deleted

## 2014-05-03 DIAGNOSIS — R351 Nocturia: Secondary | ICD-10-CM | POA: Diagnosis not present

## 2014-05-03 NOTE — Telephone Encounter (Signed)
Called patient to remind of appts. For 05-04-14, lvm for a return call

## 2014-05-04 ENCOUNTER — Ambulatory Visit
Admit: 2014-05-04 | Discharge: 2014-05-04 | Disposition: A | Discharge status: Home / Self Care | Payer: Medicare Other | Attending: Radiation Oncology | Admitting: Radiation Oncology

## 2014-05-04 ENCOUNTER — Ambulatory Visit
Admission: RE | Admit: 2014-05-04 | Discharge: 2014-05-04 | Disposition: A | Discharge status: Home / Self Care | Payer: Medicare Other | Source: Ambulatory Visit | Attending: Radiation Oncology | Admitting: Radiation Oncology

## 2014-05-04 ENCOUNTER — Encounter: Payer: Self-pay | Admitting: Radiation Oncology

## 2014-05-04 VITALS — BP 135/72 | HR 105 | Temp 98.2°F | Resp 20

## 2014-05-04 DIAGNOSIS — C61 Malignant neoplasm of prostate: Secondary | ICD-10-CM

## 2014-05-04 NOTE — Progress Notes (Signed)
Complex simulation note: The patient was taken to the CT simulator. His pelvis was scanned. The CT data set was sent to the MIM planning system where I contoured his prostate and rectum 3-D conformal planning to assess the quality of his implant.

## 2014-05-04 NOTE — Progress Notes (Signed)
CC: Dr. Franchot Gallo  Followup note: The patient returns today for review, approximately 3 weeks following his prostate seed implant with Dr. Diona Fanti. He is doing well today although he does have increase in his nocturia, otherwise no GU or GI difficulties.Marland Kitchen He was getting up to 6 times a night, and is now only getting up 2 times a night after restricting fluids in the evening. His CT scan today shows what appears to be a good seed distribution.  Physical examination: Alert and oriented. Filed Vitals:   05/04/14 1107  BP: 135/72  Pulse: 105  Temp: 98.2 F (36.8 C)  Resp: 20   Rectal examination not performed today.  Impression: Satisfactory progress. We'll move ahead with his post implant dosimetry and forward the results of Dr. Diona Fanti. I expect him to have favorable dosimetry.  Plan: Followup through Dr. Diona Fanti. I ask that Dr. Diona Fanti keep me posted on his progress.

## 2014-05-04 NOTE — Progress Notes (Signed)
Patient denies pain, fatigue, loss of appetite, bowel issues. He states he was having nocturia x 4-6 , but he saw Dr Diona Fanti yesterday and was told to cut back on soft drinks and coffee. He states he was only up twice last night to void. He denies other urinary issues.

## 2014-05-08 ENCOUNTER — Encounter: Payer: Self-pay | Admitting: Radiation Oncology

## 2014-05-08 DIAGNOSIS — C61 Malignant neoplasm of prostate: Secondary | ICD-10-CM | POA: Diagnosis not present

## 2014-05-08 NOTE — Progress Notes (Signed)
Cc: Dr. Franchot Gallo  Post-implant CT dosimetry note: The patient completed post-implant dosimetry 05/08/2014. His intraoperative prostate volume by ultrasound was 32.9 cc and his postoperative prostate volume by CT was 35.1 cc, a good correlation. Dose volume histograms were obtained for the prostate and rectum. His prostate D 90 is 104.6% and his V100 is 92.1%, both excellent. 0 cc of rectum receives the prescribed dose of 14,500 cGy. In summary, he has excellent post-implant dosimetry with a low risk for rectal toxicity.

## 2014-05-30 DIAGNOSIS — B351 Tinea unguium: Secondary | ICD-10-CM | POA: Diagnosis not present

## 2014-05-30 DIAGNOSIS — M79609 Pain in unspecified limb: Secondary | ICD-10-CM | POA: Diagnosis not present

## 2014-05-30 DIAGNOSIS — M79675 Pain in left toe(s): Secondary | ICD-10-CM | POA: Diagnosis not present

## 2014-07-26 DIAGNOSIS — Z23 Encounter for immunization: Secondary | ICD-10-CM | POA: Diagnosis not present

## 2014-08-07 DIAGNOSIS — Z125 Encounter for screening for malignant neoplasm of prostate: Secondary | ICD-10-CM | POA: Diagnosis not present

## 2014-08-07 DIAGNOSIS — E785 Hyperlipidemia, unspecified: Secondary | ICD-10-CM | POA: Diagnosis not present

## 2014-08-07 DIAGNOSIS — I10 Essential (primary) hypertension: Secondary | ICD-10-CM | POA: Diagnosis not present

## 2014-08-07 DIAGNOSIS — E559 Vitamin D deficiency, unspecified: Secondary | ICD-10-CM | POA: Diagnosis not present

## 2014-08-07 DIAGNOSIS — Z79899 Other long term (current) drug therapy: Secondary | ICD-10-CM | POA: Diagnosis not present

## 2014-08-07 DIAGNOSIS — E1165 Type 2 diabetes mellitus with hyperglycemia: Secondary | ICD-10-CM | POA: Diagnosis not present

## 2014-08-14 DIAGNOSIS — E785 Hyperlipidemia, unspecified: Secondary | ICD-10-CM | POA: Diagnosis not present

## 2014-08-14 DIAGNOSIS — C61 Malignant neoplasm of prostate: Secondary | ICD-10-CM | POA: Diagnosis not present

## 2014-08-14 DIAGNOSIS — Z6826 Body mass index (BMI) 26.0-26.9, adult: Secondary | ICD-10-CM | POA: Diagnosis not present

## 2014-08-14 DIAGNOSIS — E559 Vitamin D deficiency, unspecified: Secondary | ICD-10-CM | POA: Diagnosis not present

## 2014-08-14 DIAGNOSIS — Z1212 Encounter for screening for malignant neoplasm of rectum: Secondary | ICD-10-CM | POA: Diagnosis not present

## 2014-08-14 DIAGNOSIS — N183 Chronic kidney disease, stage 3 (moderate): Secondary | ICD-10-CM | POA: Diagnosis not present

## 2014-08-14 DIAGNOSIS — I10 Essential (primary) hypertension: Secondary | ICD-10-CM | POA: Diagnosis not present

## 2014-08-14 DIAGNOSIS — Z79899 Other long term (current) drug therapy: Secondary | ICD-10-CM | POA: Diagnosis not present

## 2014-08-14 DIAGNOSIS — Z0000 Encounter for general adult medical examination without abnormal findings: Secondary | ICD-10-CM | POA: Diagnosis not present

## 2014-08-14 DIAGNOSIS — E1165 Type 2 diabetes mellitus with hyperglycemia: Secondary | ICD-10-CM | POA: Diagnosis not present

## 2014-08-14 DIAGNOSIS — Z1389 Encounter for screening for other disorder: Secondary | ICD-10-CM | POA: Diagnosis not present

## 2014-08-14 DIAGNOSIS — Z923 Personal history of irradiation: Secondary | ICD-10-CM | POA: Diagnosis not present

## 2014-09-06 DIAGNOSIS — C61 Malignant neoplasm of prostate: Secondary | ICD-10-CM | POA: Diagnosis not present

## 2014-11-01 ENCOUNTER — Encounter: Payer: Self-pay | Admitting: Internal Medicine

## 2014-11-17 ENCOUNTER — Ambulatory Visit (AMBULATORY_SURGERY_CENTER): Payer: Self-pay | Admitting: *Deleted

## 2014-11-17 VITALS — Ht 71.0 in | Wt 195.4 lb

## 2014-11-17 DIAGNOSIS — Z1211 Encounter for screening for malignant neoplasm of colon: Secondary | ICD-10-CM

## 2014-11-17 DIAGNOSIS — H4921 Sixth [abducent] nerve palsy, right eye: Secondary | ICD-10-CM | POA: Diagnosis not present

## 2014-11-17 MED ORDER — NA SULFATE-K SULFATE-MG SULF 17.5-3.13-1.6 GM/177ML PO SOLN: 1.0000 | Freq: Once | ORAL | Status: DC

## 2014-11-17 NOTE — Progress Notes (Signed)
No egg soy allergy No issues with past sedation No home 02 No diet pills emmi declined

## 2014-12-01 ENCOUNTER — Encounter: Payer: Self-pay | Admitting: Internal Medicine

## 2014-12-01 ENCOUNTER — Ambulatory Visit (AMBULATORY_SURGERY_CENTER): Payer: Medicare Other | Admitting: Internal Medicine

## 2014-12-01 VITALS — BP 128/79 | HR 95 | Temp 97.5°F | Resp 20 | Ht 71.0 in | Wt 195.0 lb

## 2014-12-01 DIAGNOSIS — Z1211 Encounter for screening for malignant neoplasm of colon: Secondary | ICD-10-CM

## 2014-12-01 DIAGNOSIS — I10 Essential (primary) hypertension: Secondary | ICD-10-CM | POA: Diagnosis not present

## 2014-12-01 DIAGNOSIS — E119 Type 2 diabetes mellitus without complications: Secondary | ICD-10-CM | POA: Diagnosis not present

## 2014-12-01 DIAGNOSIS — E669 Obesity, unspecified: Secondary | ICD-10-CM | POA: Diagnosis not present

## 2014-12-01 MED ORDER — SODIUM CHLORIDE 0.9 % IV SOLN: 500.0000 mL | INTRAVENOUS | Status: DC

## 2014-12-01 NOTE — Progress Notes (Signed)
Report to PACU, RN, vss, BBS= Clear.  

## 2014-12-01 NOTE — Op Note (Signed)
[facility]  [address] [place], [address]   COLONOSCOPY PROCEDURE REPORT  PATIENT: Miguel Powers, Miguel Powers  MR#: [id number] BIRTHDATE: [birth year] , 7  yrs. old GENDER: male ENDOSCOPIST: Jerene Bears, MD REFERRED [id number] Ardeth Perfect, M.D. PROCEDURE DATE:  12/01/2014 PROCEDURE:   Colonoscopy, screening First Screening Colonoscopy - Avg.  risk and is 50 yrs.  old or older - No.  Prior Negative Screening - Now for repeat screening. 10 or more years since last screening  History of Adenoma - Now for follow-up colonoscopy & has been > or = to 3 yrs.  N/A  Polyps removed today? No Recommend repeat exam, <10 yrs? No ASA CLASS:   Class II INDICATIONS:Screening for colonic neoplasia and  average risk, colonoscopy 10 years ago with Dr. Gala Romney. MEDICATIONS: Monitored anesthesia care and Propofol 200 mg IV  DESCRIPTION OF PROCEDURE:   After the risks benefits and alternatives of the procedure were thoroughly explained, informed consent was obtained.  The digital rectal exam revealed no rectal mass.   The LB [id number]  endoscope was introduced through the anus and advanced to the cecum, which was identified by both the appendix and ileocecal valve. No adverse events experienced. The quality of the prep was good.  (Suprep was used)  The instrument was then slowly withdrawn as the colon was fully examined. Estimated blood loss is zero unless otherwise noted in this procedure report.      COLON FINDINGS: There was mild diverticulosis noted in the sigmoid colon.   The examination was otherwise normal.  Retroflexed views revealed internal hemorrhoids. The time to cecum = 3.3 Withdrawal time = 8.4   The scope was withdrawn and the procedure completed. COMPLICATIONS: There were no immediate complications.  ENDOSCOPIC IMPRESSION: 1.   Mild diverticulosis was noted in the sigmoid colon 2.   The examination was otherwise normal  RECOMMENDATIONS: You should continue to  follow colorectal cancer screening guidelines for "routine risk" patients with a repeat colonoscopy in 10 years. There is no need for FOBT (stool) testing for at least 5 years.  eSigned:  Jerene Bears, MD 12/01/2014 9:16 AM   cc:  the patient, Dr. Ardeth Perfect ([place])

## 2014-12-01 NOTE — Patient Instructions (Signed)
YOU HAD AN ENDOSCOPIC PROCEDURE TODAY AT [facility]:   Refer to the procedure report that was given to you for any specific questions about what was found during the examination.  If the procedure report does not answer your questions, please call your gastroenterologist to clarify.  If you requested that your care partner not be given the details of your procedure findings, then the procedure report has been included in a sealed envelope for you to review at your convenience later.  YOU SHOULD EXPECT: Some feelings of bloating in the abdomen. Passage of more gas than usual.  Walking can help get rid of the air that was put into your GI tract during the procedure and reduce the bloating. If you had a lower endoscopy (such as a colonoscopy or flexible sigmoidoscopy) you may notice spotting of blood in your stool or on the toilet paper. If you underwent a bowel prep for your procedure, you may not have a normal bowel movement for a few days.  Please Note:  You might notice some irritation and congestion in your nose or some drainage.  This is from the oxygen used during your procedure.  There is no need for concern and it should clear up in a day or so.  SYMPTOMS TO REPORT IMMEDIATELY:   Following lower endoscopy (colonoscopy or flexible sigmoidoscopy):  Excessive amounts of blood in the stool  Significant tenderness or worsening of abdominal pains  Swelling of the abdomen that is new, acute  Fever of 100F or higher    For urgent or emergent issues, a gastroenterologist can be reached at any hour by calling [phone].   DIET: Your first meal following the procedure should be a small meal and then it is ok to progress to your normal diet. Heavy or fried foods are harder to digest and may make you feel nauseous or bloated.  Likewise, meals heavy in dairy and vegetables can increase bloating.  Drink plenty of fluids but you should avoid alcoholic beverages for 24  hours.  ACTIVITY:  You should plan to take it easy for the rest of today and you should NOT DRIVE or use heavy machinery until tomorrow (because of the sedation medicines used during the test).    FOLLOW UP: Our staff will call the number listed on your records the next business day following your procedure to check on you and address any questions or concerns that you may have regarding the information given to you following your procedure. If we do not reach you, we will leave a message.  However, if you are feeling well and you are not experiencing any problems, there is no need to return our call.  We will assume that you have returned to your regular daily activities without incident.  If any biopsies were taken you will be contacted by phone or by letter within the next 1-3 weeks.  Please call us at [phone] if you have not heard about the biopsies in 3 weeks.    SIGNATURES/CONFIDENTIALITY: You and/or your care partner have signed paperwork which will be entered into your electronic medical record.  These signatures attest to the fact that that the information above on your After Visit Summary has been reviewed and is understood.  Full responsibility of the confidentiality of this discharge information lies with you and/or your care-partner.   INFORMATION ON DIVERTICULOSIS GIVEN TO YOU TODAY

## 2014-12-04 ENCOUNTER — Telehealth: Payer: Self-pay | Admitting: *Deleted

## 2014-12-04 NOTE — Telephone Encounter (Signed)
  Follow up Call-  Call back number 12/01/2014  Post procedure Call Back phone  # [phone]  Permission to leave phone message Yes     Patient questions:  Do you have a fever, pain , or abdominal swelling? No. Pain Score  0 *  Have you tolerated food without any problems? Yes.    Have you been able to return to your normal activities? Yes.    Do you have any questions about your discharge instructions: Diet   No. Medications  No. Follow up visit  No.  Do you have questions or concerns about your Care? No.  Actions: * If pain score is 4 or above: No action needed, pain <4.

## 2014-12-06 DIAGNOSIS — R8290 Unspecified abnormal findings in urine: Secondary | ICD-10-CM | POA: Diagnosis not present

## 2014-12-06 DIAGNOSIS — Z6826 Body mass index (BMI) 26.0-26.9, adult: Secondary | ICD-10-CM | POA: Diagnosis not present

## 2014-12-06 DIAGNOSIS — E1165 Type 2 diabetes mellitus with hyperglycemia: Secondary | ICD-10-CM | POA: Diagnosis not present

## 2014-12-06 DIAGNOSIS — R300 Dysuria: Secondary | ICD-10-CM | POA: Diagnosis not present

## 2014-12-06 DIAGNOSIS — I129 Hypertensive chronic kidney disease with stage 1 through stage 4 chronic kidney disease, or unspecified chronic kidney disease: Secondary | ICD-10-CM | POA: Diagnosis not present

## 2014-12-06 DIAGNOSIS — I10 Essential (primary) hypertension: Secondary | ICD-10-CM | POA: Diagnosis not present

## 2014-12-29 DIAGNOSIS — C61 Malignant neoplasm of prostate: Secondary | ICD-10-CM | POA: Diagnosis not present

## 2015-01-17 DIAGNOSIS — R351 Nocturia: Secondary | ICD-10-CM | POA: Diagnosis not present

## 2015-01-17 DIAGNOSIS — C61 Malignant neoplasm of prostate: Secondary | ICD-10-CM | POA: Diagnosis not present

## 2015-03-08 DIAGNOSIS — E1121 Type 2 diabetes mellitus with diabetic nephropathy: Secondary | ICD-10-CM | POA: Diagnosis not present

## 2015-03-08 DIAGNOSIS — J3489 Other specified disorders of nose and nasal sinuses: Secondary | ICD-10-CM | POA: Diagnosis not present

## 2015-03-08 DIAGNOSIS — Z794 Long term (current) use of insulin: Secondary | ICD-10-CM | POA: Diagnosis not present

## 2015-03-08 DIAGNOSIS — Z6826 Body mass index (BMI) 26.0-26.9, adult: Secondary | ICD-10-CM | POA: Diagnosis not present

## 2015-03-08 DIAGNOSIS — N183 Chronic kidney disease, stage 3 (moderate): Secondary | ICD-10-CM | POA: Diagnosis not present

## 2015-03-08 DIAGNOSIS — I10 Essential (primary) hypertension: Secondary | ICD-10-CM | POA: Diagnosis not present

## 2015-03-08 DIAGNOSIS — E1165 Type 2 diabetes mellitus with hyperglycemia: Secondary | ICD-10-CM | POA: Diagnosis not present

## 2015-06-11 DIAGNOSIS — E785 Hyperlipidemia, unspecified: Secondary | ICD-10-CM | POA: Diagnosis not present

## 2015-06-11 DIAGNOSIS — I10 Essential (primary) hypertension: Secondary | ICD-10-CM | POA: Diagnosis not present

## 2015-06-11 DIAGNOSIS — E119 Type 2 diabetes mellitus without complications: Secondary | ICD-10-CM | POA: Diagnosis not present

## 2015-06-11 DIAGNOSIS — E1165 Type 2 diabetes mellitus with hyperglycemia: Secondary | ICD-10-CM | POA: Diagnosis not present

## 2015-06-11 DIAGNOSIS — N183 Chronic kidney disease, stage 3 (moderate): Secondary | ICD-10-CM | POA: Diagnosis not present

## 2015-06-11 DIAGNOSIS — I129 Hypertensive chronic kidney disease with stage 1 through stage 4 chronic kidney disease, or unspecified chronic kidney disease: Secondary | ICD-10-CM | POA: Diagnosis not present

## 2015-06-11 DIAGNOSIS — Z8546 Personal history of malignant neoplasm of prostate: Secondary | ICD-10-CM | POA: Diagnosis not present

## 2015-06-11 DIAGNOSIS — E1121 Type 2 diabetes mellitus with diabetic nephropathy: Secondary | ICD-10-CM | POA: Diagnosis not present

## 2015-06-11 DIAGNOSIS — Z6827 Body mass index (BMI) 27.0-27.9, adult: Secondary | ICD-10-CM | POA: Diagnosis not present

## 2015-08-13 DIAGNOSIS — C61 Malignant neoplasm of prostate: Secondary | ICD-10-CM | POA: Diagnosis not present

## 2015-08-14 DIAGNOSIS — R8290 Unspecified abnormal findings in urine: Secondary | ICD-10-CM | POA: Diagnosis not present

## 2015-08-14 DIAGNOSIS — Z125 Encounter for screening for malignant neoplasm of prostate: Secondary | ICD-10-CM | POA: Diagnosis not present

## 2015-08-14 DIAGNOSIS — E119 Type 2 diabetes mellitus without complications: Secondary | ICD-10-CM | POA: Diagnosis not present

## 2015-08-14 DIAGNOSIS — E559 Vitamin D deficiency, unspecified: Secondary | ICD-10-CM | POA: Diagnosis not present

## 2015-08-20 DIAGNOSIS — C61 Malignant neoplasm of prostate: Secondary | ICD-10-CM | POA: Diagnosis not present

## 2015-08-20 DIAGNOSIS — R351 Nocturia: Secondary | ICD-10-CM | POA: Diagnosis not present

## 2015-08-20 DIAGNOSIS — Z0000 Encounter for general adult medical examination without abnormal findings: Secondary | ICD-10-CM | POA: Diagnosis not present

## 2015-08-21 DIAGNOSIS — Z8546 Personal history of malignant neoplasm of prostate: Secondary | ICD-10-CM | POA: Diagnosis not present

## 2015-08-21 DIAGNOSIS — N183 Chronic kidney disease, stage 3 (moderate): Secondary | ICD-10-CM | POA: Diagnosis not present

## 2015-08-21 DIAGNOSIS — E784 Other hyperlipidemia: Secondary | ICD-10-CM | POA: Diagnosis not present

## 2015-08-21 DIAGNOSIS — I129 Hypertensive chronic kidney disease with stage 1 through stage 4 chronic kidney disease, or unspecified chronic kidney disease: Secondary | ICD-10-CM | POA: Diagnosis not present

## 2015-08-21 DIAGNOSIS — E1121 Type 2 diabetes mellitus with diabetic nephropathy: Secondary | ICD-10-CM | POA: Diagnosis not present

## 2015-08-21 DIAGNOSIS — Z1389 Encounter for screening for other disorder: Secondary | ICD-10-CM | POA: Diagnosis not present

## 2015-08-21 DIAGNOSIS — K219 Gastro-esophageal reflux disease without esophagitis: Secondary | ICD-10-CM | POA: Diagnosis not present

## 2015-08-21 DIAGNOSIS — Z794 Long term (current) use of insulin: Secondary | ICD-10-CM | POA: Diagnosis not present

## 2015-08-21 DIAGNOSIS — I10 Essential (primary) hypertension: Secondary | ICD-10-CM | POA: Diagnosis not present

## 2015-08-21 DIAGNOSIS — Z0000 Encounter for general adult medical examination without abnormal findings: Secondary | ICD-10-CM | POA: Diagnosis not present

## 2015-08-21 DIAGNOSIS — E559 Vitamin D deficiency, unspecified: Secondary | ICD-10-CM | POA: Diagnosis not present

## 2015-08-21 DIAGNOSIS — E119 Type 2 diabetes mellitus without complications: Secondary | ICD-10-CM | POA: Diagnosis not present
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
## 2015-11-20 DIAGNOSIS — E1165 Type 2 diabetes mellitus with hyperglycemia: Secondary | ICD-10-CM | POA: Diagnosis not present

## 2015-11-20 DIAGNOSIS — J3489 Other specified disorders of nose and nasal sinuses: Secondary | ICD-10-CM | POA: Diagnosis not present

## 2015-11-20 DIAGNOSIS — Z6827 Body mass index (BMI) 27.0-27.9, adult: Secondary | ICD-10-CM | POA: Diagnosis not present

## 2015-11-20 DIAGNOSIS — E119 Type 2 diabetes mellitus without complications: Secondary | ICD-10-CM | POA: Diagnosis not present

## 2015-11-20 DIAGNOSIS — I10 Essential (primary) hypertension: Secondary | ICD-10-CM | POA: Diagnosis not present

## 2016-03-05 DIAGNOSIS — C61 Malignant neoplasm of prostate: Secondary | ICD-10-CM | POA: Diagnosis not present

## 2016-03-12 DIAGNOSIS — C61 Malignant neoplasm of prostate: Secondary | ICD-10-CM | POA: Diagnosis not present

## 2016-03-12 DIAGNOSIS — N5201 Erectile dysfunction due to arterial insufficiency: Secondary | ICD-10-CM | POA: Diagnosis not present

## 2016-03-19 DIAGNOSIS — I10 Essential (primary) hypertension: Secondary | ICD-10-CM | POA: Diagnosis not present

## 2016-03-19 DIAGNOSIS — Z23 Encounter for immunization: Secondary | ICD-10-CM | POA: Diagnosis not present

## 2016-03-19 DIAGNOSIS — E1165 Type 2 diabetes mellitus with hyperglycemia: Secondary | ICD-10-CM | POA: Diagnosis not present

## 2016-03-19 DIAGNOSIS — Z6827 Body mass index (BMI) 27.0-27.9, adult: Secondary | ICD-10-CM | POA: Diagnosis not present

## 2016-03-19 DIAGNOSIS — E784 Other hyperlipidemia: Secondary | ICD-10-CM | POA: Diagnosis not present

## 2016-08-20 DIAGNOSIS — R8299 Other abnormal findings in urine: Secondary | ICD-10-CM | POA: Diagnosis not present

## 2016-08-20 DIAGNOSIS — E784 Other hyperlipidemia: Secondary | ICD-10-CM | POA: Diagnosis not present

## 2016-08-20 DIAGNOSIS — E1165 Type 2 diabetes mellitus with hyperglycemia: Secondary | ICD-10-CM | POA: Diagnosis not present

## 2016-08-20 DIAGNOSIS — E559 Vitamin D deficiency, unspecified: Secondary | ICD-10-CM | POA: Diagnosis not present

## 2016-08-27 DIAGNOSIS — Z8546 Personal history of malignant neoplasm of prostate: Secondary | ICD-10-CM | POA: Diagnosis not present

## 2016-08-27 DIAGNOSIS — Z1389 Encounter for screening for other disorder: Secondary | ICD-10-CM | POA: Diagnosis not present

## 2016-08-27 DIAGNOSIS — N183 Chronic kidney disease, stage 3 (moderate): Secondary | ICD-10-CM | POA: Diagnosis not present

## 2016-08-27 DIAGNOSIS — E784 Other hyperlipidemia: Secondary | ICD-10-CM | POA: Diagnosis not present

## 2016-08-27 DIAGNOSIS — K219 Gastro-esophageal reflux disease without esophagitis: Secondary | ICD-10-CM | POA: Diagnosis not present

## 2016-08-27 DIAGNOSIS — I10 Essential (primary) hypertension: Secondary | ICD-10-CM | POA: Diagnosis not present

## 2016-08-27 DIAGNOSIS — E1165 Type 2 diabetes mellitus with hyperglycemia: Secondary | ICD-10-CM | POA: Diagnosis not present

## 2016-08-27 DIAGNOSIS — E559 Vitamin D deficiency, unspecified: Secondary | ICD-10-CM | POA: Diagnosis not present

## 2016-08-27 DIAGNOSIS — E1121 Type 2 diabetes mellitus with diabetic nephropathy: Secondary | ICD-10-CM | POA: Diagnosis not present

## 2016-08-27 DIAGNOSIS — Z794 Long term (current) use of insulin: Secondary | ICD-10-CM | POA: Diagnosis not present

## 2016-08-27 DIAGNOSIS — Z6828 Body mass index (BMI) 28.0-28.9, adult: Secondary | ICD-10-CM | POA: Diagnosis not present

## 2016-08-27 DIAGNOSIS — Z0000 Encounter for general adult medical examination without abnormal findings: Secondary | ICD-10-CM | POA: Diagnosis not present

## 2016-08-28 DIAGNOSIS — Z1212 Encounter for screening for malignant neoplasm of rectum: Secondary | ICD-10-CM | POA: Diagnosis not present

## 2016-11-03 ENCOUNTER — Other Ambulatory Visit: Payer: TRICARE For Life (TFL)

## 2016-11-18 ENCOUNTER — Ambulatory Visit: Payer: TRICARE For Life (TFL) | Admitting: Internal Medicine

## 2016-12-25 DIAGNOSIS — Z6827 Body mass index (BMI) 27.0-27.9, adult: Secondary | ICD-10-CM | POA: Diagnosis not present

## 2016-12-25 DIAGNOSIS — E784 Other hyperlipidemia: Secondary | ICD-10-CM | POA: Diagnosis not present

## 2016-12-25 DIAGNOSIS — I10 Essential (primary) hypertension: Secondary | ICD-10-CM | POA: Diagnosis not present

## 2016-12-25 DIAGNOSIS — E1121 Type 2 diabetes mellitus with diabetic nephropathy: Secondary | ICD-10-CM | POA: Diagnosis not present

## 2016-12-25 DIAGNOSIS — Z794 Long term (current) use of insulin: Secondary | ICD-10-CM | POA: Diagnosis not present

## 2016-12-25 DIAGNOSIS — E1165 Type 2 diabetes mellitus with hyperglycemia: Secondary | ICD-10-CM | POA: Diagnosis not present

## 2017-03-10 ENCOUNTER — Ambulatory Visit (INDEPENDENT_AMBULATORY_CARE_PROVIDER_SITE_OTHER): Payer: Medicare Other | Admitting: Urology

## 2017-03-10 ENCOUNTER — Other Ambulatory Visit (HOSPITAL_COMMUNITY)
Admission: RE | Admit: 2017-03-10 | Discharge: 2017-03-10 | Disposition: A | Discharge status: Home / Self Care | Payer: Medicare Other | Source: Other Acute Inpatient Hospital | Attending: Urology | Admitting: Urology

## 2017-03-10 DIAGNOSIS — R351 Nocturia: Secondary | ICD-10-CM | POA: Diagnosis not present

## 2017-03-10 DIAGNOSIS — C61 Malignant neoplasm of prostate: Secondary | ICD-10-CM | POA: Insufficient documentation

## 2017-03-10 LAB — URINALYSIS, COMPLETE (UACMP) WITH MICROSCOPIC
BACTERIA UA: NONE SEEN
Bilirubin Urine: NEGATIVE
Glucose, UA: NEGATIVE mg/dL
KETONES UR: NEGATIVE mg/dL
Leukocytes, UA: NEGATIVE
Nitrite: NEGATIVE
PROTEIN: 30 mg/dL — AB
SQUAMOUS EPITHELIAL / LPF: NONE SEEN
Specific Gravity, Urine: 1.016 (ref 1.005–1.030)
pH: 5 (ref 5.0–8.0)

## 2017-03-18 ENCOUNTER — Encounter: Payer: Self-pay | Admitting: Podiatry

## 2017-03-18 ENCOUNTER — Ambulatory Visit (INDEPENDENT_AMBULATORY_CARE_PROVIDER_SITE_OTHER): Payer: No Typology Code available for payment source | Admitting: Podiatry

## 2017-03-18 VITALS — BP 158/85 | HR 94

## 2017-03-18 DIAGNOSIS — M79675 Pain in left toe(s): Secondary | ICD-10-CM

## 2017-03-18 DIAGNOSIS — M79674 Pain in right toe(s): Secondary | ICD-10-CM | POA: Diagnosis not present

## 2017-03-18 DIAGNOSIS — E119 Type 2 diabetes mellitus without complications: Secondary | ICD-10-CM

## 2017-03-18 DIAGNOSIS — B351 Tinea unguium: Secondary | ICD-10-CM

## 2017-03-18 NOTE — Progress Notes (Signed)
   Subjective:    Patient ID: Miguel Powers, male    DOB: [birth year], 71 y.o.   MRN: [id number]  HPI this patient presents the office with chief complaint of long painful nails.  Patient states the nails are painful walking and wearing his shoes.  He says he is a diabetic taking medication and insulin.  Patient was referred to this office by the [facility].  He says he is diabetic for over 12 years.  He presents the office today for an evaluation and treatment of his nails.    Review of Systems  All other systems reviewed and are negative.      Objective:   Physical Exam GENERAL APPEARANCE: Alert, conversant. Appropriately groomed. No acute distress.  VASCULAR: Pedal pulses are  palpable at  DP  bilateral.  PT pulses are not palpable. Capillary refill time is immediate to all digits,  Normal temperature gradient.   NEUROLOGIC: sensation is normal to 5.07 monofilament at 5/5 sites bilateral.  Light touch is intact bilateral, Muscle strength normal.  MUSCULOSKELETAL: acceptable muscle strength, tone and stability bilateral.  Intrinsic muscluature intact bilateral.  Rectus appearance of foot and digits noted bilateral.  NAILS  thick disfigured discolored hallux toenails bilaterally.  No evidence of bacterial infection or drainage DERMATOLOGIC: skin color, texture, and turgor are within normal limits.  No preulcerative lesions or ulcers  are seen, no interdigital maceration noted.  No open lesions present.  . No drainage noted.         Assessment & Plan:  Onychomycosis x 2.  Diabetes with vascular changes.   IE  Debridement of nails  B/L hallux.  RTC 4 months.   Gardiner Barefoot DPM

## 2017-04-22 DIAGNOSIS — E7849 Other hyperlipidemia: Secondary | ICD-10-CM | POA: Diagnosis not present

## 2017-04-22 DIAGNOSIS — Z6827 Body mass index (BMI) 27.0-27.9, adult: Secondary | ICD-10-CM | POA: Diagnosis not present

## 2017-04-22 DIAGNOSIS — I10 Essential (primary) hypertension: Secondary | ICD-10-CM | POA: Diagnosis not present

## 2017-04-22 DIAGNOSIS — Z794 Long term (current) use of insulin: Secondary | ICD-10-CM | POA: Diagnosis not present

## 2017-04-22 DIAGNOSIS — E559 Vitamin D deficiency, unspecified: Secondary | ICD-10-CM | POA: Diagnosis not present

## 2017-04-22 DIAGNOSIS — E1165 Type 2 diabetes mellitus with hyperglycemia: Secondary | ICD-10-CM | POA: Diagnosis not present

## 2017-04-22 DIAGNOSIS — Z23 Encounter for immunization: Secondary | ICD-10-CM | POA: Diagnosis not present

## 2017-05-01 DIAGNOSIS — H2513 Age-related nuclear cataract, bilateral: Secondary | ICD-10-CM | POA: Diagnosis not present

## 2017-05-01 DIAGNOSIS — H40053 Ocular hypertension, bilateral: Secondary | ICD-10-CM | POA: Diagnosis not present

## 2017-05-01 DIAGNOSIS — E113213 Type 2 diabetes mellitus with mild nonproliferative diabetic retinopathy with macular edema, bilateral: Secondary | ICD-10-CM | POA: Diagnosis not present

## 2017-07-21 ENCOUNTER — Ambulatory Visit: Payer: No Typology Code available for payment source | Admitting: Podiatry

## 2017-09-01 DIAGNOSIS — Z125 Encounter for screening for malignant neoplasm of prostate: Secondary | ICD-10-CM | POA: Diagnosis not present

## 2017-09-01 DIAGNOSIS — N183 Chronic kidney disease, stage 3 (moderate): Secondary | ICD-10-CM | POA: Diagnosis not present

## 2017-09-01 DIAGNOSIS — R82998 Other abnormal findings in urine: Secondary | ICD-10-CM | POA: Diagnosis not present

## 2017-09-01 DIAGNOSIS — E1165 Type 2 diabetes mellitus with hyperglycemia: Secondary | ICD-10-CM | POA: Diagnosis not present

## 2017-09-01 DIAGNOSIS — E7849 Other hyperlipidemia: Secondary | ICD-10-CM | POA: Diagnosis not present

## 2017-09-01 DIAGNOSIS — E559 Vitamin D deficiency, unspecified: Secondary | ICD-10-CM | POA: Diagnosis not present

## 2017-09-09 DIAGNOSIS — E7849 Other hyperlipidemia: Secondary | ICD-10-CM | POA: Diagnosis not present

## 2017-09-09 DIAGNOSIS — N183 Chronic kidney disease, stage 3 (moderate): Secondary | ICD-10-CM | POA: Diagnosis not present

## 2017-09-09 DIAGNOSIS — Z1389 Encounter for screening for other disorder: Secondary | ICD-10-CM | POA: Diagnosis not present

## 2017-09-09 DIAGNOSIS — Z8546 Personal history of malignant neoplasm of prostate: Secondary | ICD-10-CM | POA: Diagnosis not present

## 2017-09-09 DIAGNOSIS — Z794 Long term (current) use of insulin: Secondary | ICD-10-CM | POA: Diagnosis not present

## 2017-09-09 DIAGNOSIS — I10 Essential (primary) hypertension: Secondary | ICD-10-CM | POA: Diagnosis not present

## 2017-09-09 DIAGNOSIS — E1121 Type 2 diabetes mellitus with diabetic nephropathy: Secondary | ICD-10-CM | POA: Diagnosis not present

## 2017-09-09 DIAGNOSIS — Z6827 Body mass index (BMI) 27.0-27.9, adult: Secondary | ICD-10-CM | POA: Diagnosis not present

## 2017-09-09 DIAGNOSIS — Z0000 Encounter for general adult medical examination without abnormal findings: Secondary | ICD-10-CM | POA: Diagnosis not present

## 2017-09-09 DIAGNOSIS — E1165 Type 2 diabetes mellitus with hyperglycemia: Secondary | ICD-10-CM | POA: Diagnosis not present

## 2017-09-09 DIAGNOSIS — E559 Vitamin D deficiency, unspecified: Secondary | ICD-10-CM | POA: Diagnosis not present

## 2017-09-09 DIAGNOSIS — K219 Gastro-esophageal reflux disease without esophagitis: Secondary | ICD-10-CM | POA: Diagnosis not present

## 2017-09-10 DIAGNOSIS — Z1212 Encounter for screening for malignant neoplasm of rectum: Secondary | ICD-10-CM | POA: Diagnosis not present

## 2018-03-01 DIAGNOSIS — E7849 Other hyperlipidemia: Secondary | ICD-10-CM | POA: Diagnosis not present

## 2018-03-01 DIAGNOSIS — N183 Chronic kidney disease, stage 3 (moderate): Secondary | ICD-10-CM | POA: Diagnosis not present

## 2018-03-01 DIAGNOSIS — Z6828 Body mass index (BMI) 28.0-28.9, adult: Secondary | ICD-10-CM | POA: Diagnosis not present

## 2018-03-01 DIAGNOSIS — E1165 Type 2 diabetes mellitus with hyperglycemia: Secondary | ICD-10-CM | POA: Diagnosis not present

## 2018-03-01 DIAGNOSIS — Z794 Long term (current) use of insulin: Secondary | ICD-10-CM | POA: Diagnosis not present

## 2018-03-01 DIAGNOSIS — I10 Essential (primary) hypertension: Secondary | ICD-10-CM | POA: Diagnosis not present

## 2018-03-09 ENCOUNTER — Ambulatory Visit (INDEPENDENT_AMBULATORY_CARE_PROVIDER_SITE_OTHER): Payer: Medicare Other | Admitting: Urology

## 2018-03-09 DIAGNOSIS — Z8546 Personal history of malignant neoplasm of prostate: Secondary | ICD-10-CM | POA: Diagnosis not present

## 2018-03-09 DIAGNOSIS — R351 Nocturia: Secondary | ICD-10-CM

## 2018-03-25 DIAGNOSIS — M7732 Calcaneal spur, left foot: Secondary | ICD-10-CM | POA: Diagnosis not present

## 2018-03-25 DIAGNOSIS — M79672 Pain in left foot: Secondary | ICD-10-CM | POA: Diagnosis not present

## 2018-03-25 DIAGNOSIS — M71572 Other bursitis, not elsewhere classified, left ankle and foot: Secondary | ICD-10-CM | POA: Diagnosis not present

## 2018-03-25 DIAGNOSIS — M722 Plantar fascial fibromatosis: Secondary | ICD-10-CM | POA: Diagnosis not present

## 2018-04-06 DIAGNOSIS — Z23 Encounter for immunization: Secondary | ICD-10-CM | POA: Diagnosis not present

## 2018-04-08 DIAGNOSIS — M71572 Other bursitis, not elsewhere classified, left ankle and foot: Secondary | ICD-10-CM | POA: Diagnosis not present

## 2018-04-08 DIAGNOSIS — M722 Plantar fascial fibromatosis: Secondary | ICD-10-CM | POA: Diagnosis not present

## 2018-04-23 DIAGNOSIS — M71572 Other bursitis, not elsewhere classified, left ankle and foot: Secondary | ICD-10-CM | POA: Diagnosis not present

## 2018-04-23 DIAGNOSIS — M722 Plantar fascial fibromatosis: Secondary | ICD-10-CM | POA: Diagnosis not present

## 2018-05-14 DIAGNOSIS — M722 Plantar fascial fibromatosis: Secondary | ICD-10-CM | POA: Diagnosis not present

## 2018-05-14 DIAGNOSIS — M71572 Other bursitis, not elsewhere classified, left ankle and foot: Secondary | ICD-10-CM | POA: Diagnosis not present

## 2018-06-01 DIAGNOSIS — M7732 Calcaneal spur, left foot: Secondary | ICD-10-CM | POA: Diagnosis not present

## 2018-06-01 DIAGNOSIS — M722 Plantar fascial fibromatosis: Secondary | ICD-10-CM | POA: Diagnosis not present

## 2018-06-01 DIAGNOSIS — M65872 Other synovitis and tenosynovitis, left ankle and foot: Secondary | ICD-10-CM | POA: Diagnosis not present

## 2018-06-01 DIAGNOSIS — M12272 Villonodular synovitis (pigmented), left ankle and foot: Secondary | ICD-10-CM | POA: Diagnosis not present

## 2018-06-14 DIAGNOSIS — M25572 Pain in left ankle and joints of left foot: Secondary | ICD-10-CM | POA: Diagnosis not present

## 2018-06-14 DIAGNOSIS — M65872 Other synovitis and tenosynovitis, left ankle and foot: Secondary | ICD-10-CM | POA: Diagnosis not present

## 2018-06-14 DIAGNOSIS — M12272 Villonodular synovitis (pigmented), left ankle and foot: Secondary | ICD-10-CM | POA: Diagnosis not present

## 2018-06-17 DIAGNOSIS — M7752 Other enthesopathy of left foot: Secondary | ICD-10-CM | POA: Diagnosis not present

## 2018-06-17 DIAGNOSIS — M958 Other specified acquired deformities of musculoskeletal system: Secondary | ICD-10-CM | POA: Diagnosis not present

## 2018-06-17 DIAGNOSIS — M722 Plantar fascial fibromatosis: Secondary | ICD-10-CM | POA: Diagnosis not present

## 2018-06-17 DIAGNOSIS — M65872 Other synovitis and tenosynovitis, left ankle and foot: Secondary | ICD-10-CM | POA: Diagnosis not present

## 2018-06-17 DIAGNOSIS — R600 Localized edema: Secondary | ICD-10-CM | POA: Diagnosis not present

## 2018-06-17 DIAGNOSIS — M7662 Achilles tendinitis, left leg: Secondary | ICD-10-CM | POA: Diagnosis not present

## 2018-06-17 DIAGNOSIS — M67874 Other specified disorders of tendon, left ankle and foot: Secondary | ICD-10-CM | POA: Diagnosis not present

## 2018-06-17 DIAGNOSIS — M25472 Effusion, left ankle: Secondary | ICD-10-CM | POA: Diagnosis not present

## 2018-06-17 DIAGNOSIS — M7672 Peroneal tendinitis, left leg: Secondary | ICD-10-CM | POA: Diagnosis not present

## 2018-06-30 DIAGNOSIS — M722 Plantar fascial fibromatosis: Secondary | ICD-10-CM | POA: Diagnosis not present

## 2018-06-30 DIAGNOSIS — M7662 Achilles tendinitis, left leg: Secondary | ICD-10-CM | POA: Diagnosis not present

## 2018-07-12 DIAGNOSIS — M6702 Short Achilles tendon (acquired), left ankle: Secondary | ICD-10-CM | POA: Diagnosis not present

## 2018-07-12 DIAGNOSIS — M722 Plantar fascial fibromatosis: Secondary | ICD-10-CM | POA: Diagnosis not present

## 2018-07-12 DIAGNOSIS — E119 Type 2 diabetes mellitus without complications: Secondary | ICD-10-CM | POA: Diagnosis not present

## 2018-07-12 DIAGNOSIS — M7662 Achilles tendinitis, left leg: Secondary | ICD-10-CM | POA: Diagnosis not present

## 2018-07-12 DIAGNOSIS — Z01812 Encounter for preprocedural laboratory examination: Secondary | ICD-10-CM | POA: Diagnosis not present

## 2018-07-20 DIAGNOSIS — M6702 Short Achilles tendon (acquired), left ankle: Secondary | ICD-10-CM | POA: Diagnosis not present

## 2018-07-20 DIAGNOSIS — M25572 Pain in left ankle and joints of left foot: Secondary | ICD-10-CM | POA: Diagnosis not present

## 2018-07-20 DIAGNOSIS — M7662 Achilles tendinitis, left leg: Secondary | ICD-10-CM | POA: Diagnosis not present

## 2018-07-20 DIAGNOSIS — M722 Plantar fascial fibromatosis: Secondary | ICD-10-CM | POA: Diagnosis not present

## 2018-07-22 DIAGNOSIS — E119 Type 2 diabetes mellitus without complications: Secondary | ICD-10-CM | POA: Diagnosis not present

## 2018-07-22 DIAGNOSIS — I10 Essential (primary) hypertension: Secondary | ICD-10-CM | POA: Diagnosis not present

## 2018-07-22 DIAGNOSIS — R2689 Other abnormalities of gait and mobility: Secondary | ICD-10-CM | POA: Diagnosis not present

## 2018-07-22 DIAGNOSIS — Z794 Long term (current) use of insulin: Secondary | ICD-10-CM | POA: Diagnosis not present

## 2018-07-22 DIAGNOSIS — Z4789 Encounter for other orthopedic aftercare: Secondary | ICD-10-CM | POA: Diagnosis not present

## 2018-07-23 DIAGNOSIS — M7732 Calcaneal spur, left foot: Secondary | ICD-10-CM | POA: Diagnosis not present

## 2018-07-23 DIAGNOSIS — M7662 Achilles tendinitis, left leg: Secondary | ICD-10-CM | POA: Diagnosis not present

## 2018-08-04 DIAGNOSIS — M7662 Achilles tendinitis, left leg: Secondary | ICD-10-CM | POA: Diagnosis not present

## 2018-08-18 DIAGNOSIS — M7662 Achilles tendinitis, left leg: Secondary | ICD-10-CM | POA: Diagnosis not present

## 2018-08-18 DIAGNOSIS — M722 Plantar fascial fibromatosis: Secondary | ICD-10-CM | POA: Diagnosis not present

## 2018-08-24 DIAGNOSIS — E119 Type 2 diabetes mellitus without complications: Secondary | ICD-10-CM | POA: Diagnosis not present

## 2018-08-24 DIAGNOSIS — M25672 Stiffness of left ankle, not elsewhere classified: Secondary | ICD-10-CM | POA: Diagnosis not present

## 2018-08-24 DIAGNOSIS — M79672 Pain in left foot: Secondary | ICD-10-CM | POA: Diagnosis not present

## 2018-08-24 DIAGNOSIS — M7732 Calcaneal spur, left foot: Secondary | ICD-10-CM | POA: Diagnosis not present

## 2018-08-24 DIAGNOSIS — M6281 Muscle weakness (generalized): Secondary | ICD-10-CM | POA: Diagnosis not present

## 2018-08-24 DIAGNOSIS — M7662 Achilles tendinitis, left leg: Secondary | ICD-10-CM | POA: Diagnosis not present

## 2018-08-24 DIAGNOSIS — M6702 Short Achilles tendon (acquired), left ankle: Secondary | ICD-10-CM | POA: Diagnosis not present

## 2018-08-24 DIAGNOSIS — R262 Difficulty in walking, not elsewhere classified: Secondary | ICD-10-CM | POA: Diagnosis not present

## 2018-08-24 DIAGNOSIS — S86002D Unspecified injury of left Achilles tendon, subsequent encounter: Secondary | ICD-10-CM | POA: Diagnosis not present

## 2018-08-27 DIAGNOSIS — M79672 Pain in left foot: Secondary | ICD-10-CM | POA: Diagnosis not present

## 2018-08-27 DIAGNOSIS — M7662 Achilles tendinitis, left leg: Secondary | ICD-10-CM | POA: Diagnosis not present

## 2018-08-27 DIAGNOSIS — M6702 Short Achilles tendon (acquired), left ankle: Secondary | ICD-10-CM | POA: Diagnosis not present

## 2018-08-27 DIAGNOSIS — S86002D Unspecified injury of left Achilles tendon, subsequent encounter: Secondary | ICD-10-CM | POA: Diagnosis not present

## 2018-08-30 DIAGNOSIS — M79672 Pain in left foot: Secondary | ICD-10-CM | POA: Diagnosis not present

## 2018-08-30 DIAGNOSIS — M6702 Short Achilles tendon (acquired), left ankle: Secondary | ICD-10-CM | POA: Diagnosis not present

## 2018-08-30 DIAGNOSIS — S86002D Unspecified injury of left Achilles tendon, subsequent encounter: Secondary | ICD-10-CM | POA: Diagnosis not present

## 2018-08-30 DIAGNOSIS — M7662 Achilles tendinitis, left leg: Secondary | ICD-10-CM | POA: Diagnosis not present

## 2018-08-31 DIAGNOSIS — M6702 Short Achilles tendon (acquired), left ankle: Secondary | ICD-10-CM | POA: Diagnosis not present

## 2018-08-31 DIAGNOSIS — M7662 Achilles tendinitis, left leg: Secondary | ICD-10-CM | POA: Diagnosis not present

## 2018-08-31 DIAGNOSIS — S86002D Unspecified injury of left Achilles tendon, subsequent encounter: Secondary | ICD-10-CM | POA: Diagnosis not present

## 2018-08-31 DIAGNOSIS — M79672 Pain in left foot: Secondary | ICD-10-CM | POA: Diagnosis not present

## 2018-09-08 DIAGNOSIS — M7662 Achilles tendinitis, left leg: Secondary | ICD-10-CM | POA: Diagnosis not present

## 2018-09-08 DIAGNOSIS — M79672 Pain in left foot: Secondary | ICD-10-CM | POA: Diagnosis not present

## 2018-09-08 DIAGNOSIS — S86002D Unspecified injury of left Achilles tendon, subsequent encounter: Secondary | ICD-10-CM | POA: Diagnosis not present

## 2018-09-08 DIAGNOSIS — M6702 Short Achilles tendon (acquired), left ankle: Secondary | ICD-10-CM | POA: Diagnosis not present

## 2018-09-10 DIAGNOSIS — M79672 Pain in left foot: Secondary | ICD-10-CM | POA: Diagnosis not present

## 2018-09-10 DIAGNOSIS — M7662 Achilles tendinitis, left leg: Secondary | ICD-10-CM | POA: Diagnosis not present

## 2018-09-10 DIAGNOSIS — S86002D Unspecified injury of left Achilles tendon, subsequent encounter: Secondary | ICD-10-CM | POA: Diagnosis not present

## 2018-09-10 DIAGNOSIS — M6702 Short Achilles tendon (acquired), left ankle: Secondary | ICD-10-CM | POA: Diagnosis not present

## 2018-09-10 DIAGNOSIS — R82998 Other abnormal findings in urine: Secondary | ICD-10-CM | POA: Diagnosis not present

## 2018-09-10 DIAGNOSIS — E559 Vitamin D deficiency, unspecified: Secondary | ICD-10-CM | POA: Diagnosis not present

## 2018-09-10 DIAGNOSIS — Z125 Encounter for screening for malignant neoplasm of prostate: Secondary | ICD-10-CM | POA: Diagnosis not present

## 2018-09-10 DIAGNOSIS — N183 Chronic kidney disease, stage 3 (moderate): Secondary | ICD-10-CM | POA: Diagnosis not present

## 2018-09-10 DIAGNOSIS — E1165 Type 2 diabetes mellitus with hyperglycemia: Secondary | ICD-10-CM | POA: Diagnosis not present

## 2018-09-10 DIAGNOSIS — E7849 Other hyperlipidemia: Secondary | ICD-10-CM | POA: Diagnosis not present

## 2018-09-13 DIAGNOSIS — E119 Type 2 diabetes mellitus without complications: Secondary | ICD-10-CM | POA: Diagnosis not present

## 2018-09-13 DIAGNOSIS — M7732 Calcaneal spur, left foot: Secondary | ICD-10-CM | POA: Diagnosis not present

## 2018-09-13 DIAGNOSIS — R262 Difficulty in walking, not elsewhere classified: Secondary | ICD-10-CM | POA: Diagnosis not present

## 2018-09-13 DIAGNOSIS — M6281 Muscle weakness (generalized): Secondary | ICD-10-CM | POA: Diagnosis not present

## 2018-09-13 DIAGNOSIS — M79672 Pain in left foot: Secondary | ICD-10-CM | POA: Diagnosis not present

## 2018-09-13 DIAGNOSIS — M25672 Stiffness of left ankle, not elsewhere classified: Secondary | ICD-10-CM | POA: Diagnosis not present

## 2018-09-13 DIAGNOSIS — M6702 Short Achilles tendon (acquired), left ankle: Secondary | ICD-10-CM | POA: Diagnosis not present

## 2018-09-13 DIAGNOSIS — M7662 Achilles tendinitis, left leg: Secondary | ICD-10-CM | POA: Diagnosis not present

## 2018-09-13 DIAGNOSIS — S86002D Unspecified injury of left Achilles tendon, subsequent encounter: Secondary | ICD-10-CM | POA: Diagnosis not present

## 2018-09-14 DIAGNOSIS — M6281 Muscle weakness (generalized): Secondary | ICD-10-CM | POA: Diagnosis not present

## 2018-09-14 DIAGNOSIS — M7732 Calcaneal spur, left foot: Secondary | ICD-10-CM | POA: Diagnosis not present

## 2018-09-14 DIAGNOSIS — R262 Difficulty in walking, not elsewhere classified: Secondary | ICD-10-CM | POA: Diagnosis not present

## 2018-09-14 DIAGNOSIS — M6702 Short Achilles tendon (acquired), left ankle: Secondary | ICD-10-CM | POA: Diagnosis not present

## 2018-09-14 DIAGNOSIS — M79672 Pain in left foot: Secondary | ICD-10-CM | POA: Diagnosis not present

## 2018-09-14 DIAGNOSIS — M25672 Stiffness of left ankle, not elsewhere classified: Secondary | ICD-10-CM | POA: Diagnosis not present

## 2018-09-14 DIAGNOSIS — E119 Type 2 diabetes mellitus without complications: Secondary | ICD-10-CM | POA: Diagnosis not present

## 2018-09-14 DIAGNOSIS — S86002D Unspecified injury of left Achilles tendon, subsequent encounter: Secondary | ICD-10-CM | POA: Diagnosis not present

## 2018-09-14 DIAGNOSIS — M7662 Achilles tendinitis, left leg: Secondary | ICD-10-CM | POA: Diagnosis not present

## 2018-09-17 DIAGNOSIS — E559 Vitamin D deficiency, unspecified: Secondary | ICD-10-CM | POA: Diagnosis not present

## 2018-09-17 DIAGNOSIS — M25775 Osteophyte, left foot: Secondary | ICD-10-CM | POA: Diagnosis not present

## 2018-09-17 DIAGNOSIS — I129 Hypertensive chronic kidney disease with stage 1 through stage 4 chronic kidney disease, or unspecified chronic kidney disease: Secondary | ICD-10-CM | POA: Diagnosis not present

## 2018-09-17 DIAGNOSIS — N183 Chronic kidney disease, stage 3 (moderate): Secondary | ICD-10-CM | POA: Diagnosis not present

## 2018-09-17 DIAGNOSIS — E1165 Type 2 diabetes mellitus with hyperglycemia: Secondary | ICD-10-CM | POA: Diagnosis not present

## 2018-09-17 DIAGNOSIS — Z0000 Encounter for general adult medical examination without abnormal findings: Secondary | ICD-10-CM | POA: Diagnosis not present

## 2018-09-17 DIAGNOSIS — E1121 Type 2 diabetes mellitus with diabetic nephropathy: Secondary | ICD-10-CM | POA: Diagnosis not present

## 2018-09-17 DIAGNOSIS — Z1331 Encounter for screening for depression: Secondary | ICD-10-CM | POA: Diagnosis not present

## 2018-09-17 DIAGNOSIS — Z6826 Body mass index (BMI) 26.0-26.9, adult: Secondary | ICD-10-CM | POA: Diagnosis not present

## 2018-09-17 DIAGNOSIS — Z794 Long term (current) use of insulin: Secondary | ICD-10-CM | POA: Diagnosis not present

## 2018-09-17 DIAGNOSIS — Z8546 Personal history of malignant neoplasm of prostate: Secondary | ICD-10-CM | POA: Diagnosis not present

## 2018-09-21 DIAGNOSIS — M79672 Pain in left foot: Secondary | ICD-10-CM | POA: Diagnosis not present

## 2018-09-21 DIAGNOSIS — S86002D Unspecified injury of left Achilles tendon, subsequent encounter: Secondary | ICD-10-CM | POA: Diagnosis not present

## 2018-09-21 DIAGNOSIS — M7662 Achilles tendinitis, left leg: Secondary | ICD-10-CM | POA: Diagnosis not present

## 2018-09-21 DIAGNOSIS — M6702 Short Achilles tendon (acquired), left ankle: Secondary | ICD-10-CM | POA: Diagnosis not present

## 2018-09-21 DIAGNOSIS — Z1212 Encounter for screening for malignant neoplasm of rectum: Secondary | ICD-10-CM | POA: Diagnosis not present

## 2018-09-23 DIAGNOSIS — M7662 Achilles tendinitis, left leg: Secondary | ICD-10-CM | POA: Diagnosis not present

## 2018-09-23 DIAGNOSIS — S86002D Unspecified injury of left Achilles tendon, subsequent encounter: Secondary | ICD-10-CM | POA: Diagnosis not present

## 2018-09-23 DIAGNOSIS — M79672 Pain in left foot: Secondary | ICD-10-CM | POA: Diagnosis not present

## 2018-09-23 DIAGNOSIS — M6702 Short Achilles tendon (acquired), left ankle: Secondary | ICD-10-CM | POA: Diagnosis not present

## 2018-09-27 DIAGNOSIS — M79672 Pain in left foot: Secondary | ICD-10-CM | POA: Diagnosis not present

## 2018-09-27 DIAGNOSIS — M6702 Short Achilles tendon (acquired), left ankle: Secondary | ICD-10-CM | POA: Diagnosis not present

## 2018-09-27 DIAGNOSIS — M7662 Achilles tendinitis, left leg: Secondary | ICD-10-CM | POA: Diagnosis not present

## 2018-09-27 DIAGNOSIS — S86002D Unspecified injury of left Achilles tendon, subsequent encounter: Secondary | ICD-10-CM | POA: Diagnosis not present

## 2018-09-30 DIAGNOSIS — R262 Difficulty in walking, not elsewhere classified: Secondary | ICD-10-CM | POA: Diagnosis not present

## 2018-09-30 DIAGNOSIS — M25672 Stiffness of left ankle, not elsewhere classified: Secondary | ICD-10-CM | POA: Diagnosis not present

## 2018-09-30 DIAGNOSIS — M79672 Pain in left foot: Secondary | ICD-10-CM | POA: Diagnosis not present

## 2018-09-30 DIAGNOSIS — M7662 Achilles tendinitis, left leg: Secondary | ICD-10-CM | POA: Diagnosis not present

## 2018-09-30 DIAGNOSIS — M7732 Calcaneal spur, left foot: Secondary | ICD-10-CM | POA: Diagnosis not present

## 2018-09-30 DIAGNOSIS — M6281 Muscle weakness (generalized): Secondary | ICD-10-CM | POA: Diagnosis not present

## 2018-09-30 DIAGNOSIS — E119 Type 2 diabetes mellitus without complications: Secondary | ICD-10-CM | POA: Diagnosis not present

## 2018-09-30 DIAGNOSIS — M6702 Short Achilles tendon (acquired), left ankle: Secondary | ICD-10-CM | POA: Diagnosis not present

## 2018-09-30 DIAGNOSIS — S86002D Unspecified injury of left Achilles tendon, subsequent encounter: Secondary | ICD-10-CM | POA: Diagnosis not present

## 2019-01-18 DIAGNOSIS — E1165 Type 2 diabetes mellitus with hyperglycemia: Secondary | ICD-10-CM | POA: Diagnosis not present

## 2019-01-20 DIAGNOSIS — I129 Hypertensive chronic kidney disease with stage 1 through stage 4 chronic kidney disease, or unspecified chronic kidney disease: Secondary | ICD-10-CM | POA: Diagnosis not present

## 2019-01-20 DIAGNOSIS — E1165 Type 2 diabetes mellitus with hyperglycemia: Secondary | ICD-10-CM | POA: Diagnosis not present

## 2019-01-20 DIAGNOSIS — N184 Chronic kidney disease, stage 4 (severe): Secondary | ICD-10-CM | POA: Diagnosis not present

## 2019-03-15 ENCOUNTER — Ambulatory Visit (INDEPENDENT_AMBULATORY_CARE_PROVIDER_SITE_OTHER): Payer: Medicare Other | Admitting: Urology

## 2019-03-15 DIAGNOSIS — N5201 Erectile dysfunction due to arterial insufficiency: Secondary | ICD-10-CM | POA: Diagnosis not present

## 2019-03-15 DIAGNOSIS — Z8546 Personal history of malignant neoplasm of prostate: Secondary | ICD-10-CM | POA: Diagnosis not present

## 2019-05-18 DIAGNOSIS — E1169 Type 2 diabetes mellitus with other specified complication: Secondary | ICD-10-CM | POA: Diagnosis not present

## 2019-05-18 DIAGNOSIS — N184 Chronic kidney disease, stage 4 (severe): Secondary | ICD-10-CM | POA: Diagnosis not present

## 2019-05-18 DIAGNOSIS — Z794 Long term (current) use of insulin: Secondary | ICD-10-CM | POA: Diagnosis not present

## 2019-05-18 DIAGNOSIS — I129 Hypertensive chronic kidney disease with stage 1 through stage 4 chronic kidney disease, or unspecified chronic kidney disease: Secondary | ICD-10-CM | POA: Diagnosis not present

## 2019-05-24 DIAGNOSIS — E1169 Type 2 diabetes mellitus with other specified complication: Secondary | ICD-10-CM | POA: Diagnosis not present

## 2019-09-21 DIAGNOSIS — Z125 Encounter for screening for malignant neoplasm of prostate: Secondary | ICD-10-CM | POA: Diagnosis not present

## 2019-09-21 DIAGNOSIS — E7849 Other hyperlipidemia: Secondary | ICD-10-CM | POA: Diagnosis not present

## 2019-09-21 DIAGNOSIS — E1169 Type 2 diabetes mellitus with other specified complication: Secondary | ICD-10-CM | POA: Diagnosis not present

## 2019-09-21 DIAGNOSIS — E559 Vitamin D deficiency, unspecified: Secondary | ICD-10-CM | POA: Diagnosis not present

## 2019-09-28 DIAGNOSIS — E1121 Type 2 diabetes mellitus with diabetic nephropathy: Secondary | ICD-10-CM | POA: Diagnosis not present

## 2019-09-28 DIAGNOSIS — R82998 Other abnormal findings in urine: Secondary | ICD-10-CM | POA: Diagnosis not present

## 2019-09-28 DIAGNOSIS — Z8546 Personal history of malignant neoplasm of prostate: Secondary | ICD-10-CM | POA: Diagnosis not present

## 2019-09-28 DIAGNOSIS — I129 Hypertensive chronic kidney disease with stage 1 through stage 4 chronic kidney disease, or unspecified chronic kidney disease: Secondary | ICD-10-CM | POA: Diagnosis not present

## 2019-09-28 DIAGNOSIS — Z1331 Encounter for screening for depression: Secondary | ICD-10-CM | POA: Diagnosis not present

## 2019-09-28 DIAGNOSIS — N1832 Chronic kidney disease, stage 3b: Secondary | ICD-10-CM | POA: Diagnosis not present

## 2019-09-28 DIAGNOSIS — E559 Vitamin D deficiency, unspecified: Secondary | ICD-10-CM | POA: Diagnosis not present

## 2019-09-28 DIAGNOSIS — Z0000 Encounter for general adult medical examination without abnormal findings: Secondary | ICD-10-CM | POA: Diagnosis not present

## 2019-09-28 DIAGNOSIS — R0789 Other chest pain: Secondary | ICD-10-CM | POA: Diagnosis not present

## 2019-09-28 DIAGNOSIS — K219 Gastro-esophageal reflux disease without esophagitis: Secondary | ICD-10-CM | POA: Diagnosis not present

## 2019-09-28 DIAGNOSIS — E1169 Type 2 diabetes mellitus with other specified complication: Secondary | ICD-10-CM | POA: Diagnosis not present

## 2019-09-28 DIAGNOSIS — E785 Hyperlipidemia, unspecified: Secondary | ICD-10-CM | POA: Diagnosis not present

## 2019-09-28 DIAGNOSIS — I10 Essential (primary) hypertension: Secondary | ICD-10-CM | POA: Diagnosis not present

## 2019-09-28 DIAGNOSIS — Z794 Long term (current) use of insulin: Secondary | ICD-10-CM | POA: Diagnosis not present

## 2019-09-29 ENCOUNTER — Ambulatory Visit: Payer: Medicare Other | Admitting: Cardiology

## 2019-09-29 ENCOUNTER — Other Ambulatory Visit: Payer: Self-pay

## 2019-09-29 ENCOUNTER — Encounter: Payer: Self-pay | Admitting: Cardiology

## 2019-09-29 VITALS — BP 138/78 | HR 105 | Temp 98.6°F | Resp 14 | Ht 71.0 in | Wt 188.1 lb

## 2019-09-29 DIAGNOSIS — R0989 Other specified symptoms and signs involving the circulatory and respiratory systems: Secondary | ICD-10-CM | POA: Diagnosis not present

## 2019-09-29 DIAGNOSIS — R000 Tachycardia, unspecified: Secondary | ICD-10-CM | POA: Insufficient documentation

## 2019-09-29 DIAGNOSIS — R0789 Other chest pain: Secondary | ICD-10-CM

## 2019-09-29 NOTE — Progress Notes (Signed)
Patient referred by Velna Hatchet, MD for chest pain  Subjective:   Miguel Powers, male    DOB: 28-Jun-1946, 74 y.o.   MRN: [id number]   Chief Complaint  Patient presents with  . Chest Pain  . Hypertension  . Hyperlipidemia  . New Patient (Initial Visit)     HPI  74 y.o. African American male with hypertension, hyperlipidemia, type 2 DM, CKD stage 3b, referred for evaluation of chest pain.  Patient has noticed pains in left and right upper chest. Symptoms typically occur while at rest, and after eating. Symptoms have improved somewhat, since Dr. Ardeth Perfect started him on pantoprazole.They never occur with physical activity or exertion.  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   Past Medical History:  Diagnosis Date  . Allergy    seasonal allergies 2016  . History of MRSA infection    1990's--  left abd.  . Hyperlipidemia   . Hypertension   . 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 Type 2 diabetes mellitus ([place])   . Wears glasses      Past Surgical History:  Procedure Laterality Date  . ABDOMINAL EXPLORATION SURGERY  1980's   resection abscess and irrigation  . APPENDECTOMY  1970's  . COLONOSCOPY  2004   [place] -normal per pt   . PROSTATE BIOPSY    . RADIOACTIVE SEED IMPLANT N/A 04/13/2014   Procedure: RADIOACTIVE SEED IMPLANT;  Surgeon: Jorja Loa, MD;  Location[facility];  Service: Urology;  Laterality: N/A;     Social History   Tobacco Use  Smoking Status Never Smoker  Smokeless Tobacco Never Used    Social History   Substance and Sexual Activity  Alcohol Use No     Family History  Problem Relation Age of Onset  . Diabetes Mother   . Cancer Mother        lung  . Alzheimer's disease Mother   . Heart disease Father   . Colon cancer Neg Hx   . Esophageal cancer Neg Hx   . Rectal cancer Neg Hx   .  Stomach cancer Neg Hx      Current Outpatient Medications on File Prior to Visit  Medication Sig Dispense Refill  . amLODipine (NORVASC) 10 MG tablet Take 10 mg by mouth every morning.     Marland Kitchen aspirin 81 MG tablet Take 81 mg by mouth daily.    . Cholecalciferol (VITAMIN D3) 1000 UNITS CAPS Take 1 capsule by mouth daily.    . insulin NPH-regular Human (NOVOLIN 70/30) (70-30) 100 UNIT/ML injection Inject 15-20 Units into the skin 2 (two) times daily with a meal. 20 units in AM and 15 units PM    . lisinopril (PRINIVIL,ZESTRIL) 2.5 MG tablet Take 2.5 mg by mouth every morning.     . pantoprazole (PROTONIX) 20 MG tablet Take 20 mg by mouth daily.    . rosuvastatin (CRESTOR) 5 MG tablet Take 2.5 mg by mouth 3 (three) times a week. Only on Monday , Wednesday, Friday     No current facility-administered medications on file prior to visit.    Cardiovascular and other pertinent studies:   EKG 09/29/2019: Sinus tachycardia 104 bpm.  Nonspecific ST-T changes.   Recent labs: 09/21/2019: Glucose 153, BUN/Cr 25/1.9. EGFR 42. Na/K 139/4.3. Rest of the CMP normal H/H 11/36. MCV 96. Platelets 253  HbA1C 6.6% Chol 114, TG 57, HDL 34, LDL 69 TSH 1.0 normal   Review of Systems  Cardiovascular: Positive for chest pain. Negative for dyspnea on exertion, leg swelling, palpitations and syncope.         Vitals:   09/29/19 1225  BP: 138/78  Pulse: (!) 105  Resp: 14  Temp: 98.6 F (37 C)  SpO2: 96%     Body mass index is 26.23 kg/m. Filed Weights   09/29/19 1225  Weight: 188 lb 1.6 oz (85.3 kg)     Objective:   Physical Exam  Constitutional: He appears well-developed and well-nourished.  Neck: No JVD present.  Cardiovascular: Regular rhythm, normal heart sounds and intact distal pulses. Tachycardia present.  No murmur heard. Pulses:      Femoral pulses are 2+ on the right side and 2+ on the left side.      Popliteal pulses are 1+ on the right side and 1+ on the left side.        Dorsalis pedis pulses are 0 on the right side and 0 on the left side.       Posterior tibial pulses are 0 on the right side and 0 on the left side.  Pulmonary/Chest: Effort normal and breath sounds normal. He has no wheezes. He has no rales.  Musculoskeletal:        General: No edema.  Nursing note and vitals reviewed.       Assessment & Recommendations:   74 y.o. African American male with hypertension, hyperlipidemia, type 2 DM, CKD stage 3b, referred for evaluation of chest pain.  Chest pain: While he has risk factors, his chest pain is quite atypical. I will await continued trial with PPI before doing any ischemia workup. With his Cr 1.9, I would prefer conservative medical management first, even if he were to have true angina symptoms. Given his resting tachycardia, will obtain echocardiogram.   Abnormal vascular exam No claudication symptoms, no signs of critical limb ischemia. Will obtain baseline ABI.  F/u in 4 weeks.    Thank you for referring the patient to [place]. Please feel free to contact with any questions.  Nigel Mormon, MD [facility] Pager: [phone] Office: [phone]

## 2019-10-11 ENCOUNTER — Ambulatory Visit: Payer: Medicare Other

## 2019-10-11 ENCOUNTER — Other Ambulatory Visit: Payer: Self-pay

## 2019-10-11 DIAGNOSIS — R0989 Other specified symptoms and signs involving the circulatory and respiratory systems: Secondary | ICD-10-CM | POA: Diagnosis not present

## 2019-10-11 DIAGNOSIS — R0789 Other chest pain: Secondary | ICD-10-CM | POA: Diagnosis not present

## 2019-10-25 NOTE — Progress Notes (Signed)
Patient referred by Velna Hatchet, MD for chest pain  Subjective:   Miguel Powers, male    DOB: [birth year], 74 y.o.   MRN: [id number]   Chief Complaint  Patient presents with  . Chest Pain  . Follow-up    4 weeks    HPI  74 y.o. African American male with hypertension, hyperlipidemia, type 2 DM, CKD stage 3b, referred for evaluation of chest pain.  Patient continues to have symptoms of retrosternal burning sensation that usually only happens after he eats.  He has not done any significant walking recently.  He cut grass the other day and did not have any of the symptoms.     Initial consultation HPI 09/2019: Patient has noticed pains in left and right upper chest. Symptoms typically occur while at rest, and after eating. Symptoms have improved somewhat, since Dr. Ardeth Perfect started him on pantoprazole.They never occur with physical activity or exertion.  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    Current Outpatient Medications on File Prior to Visit  Medication Sig Dispense Refill  . amLODipine (NORVASC) 10 MG tablet Take 10 mg by mouth every morning.     Marland Kitchen aspirin 81 MG tablet Take 81 mg by mouth daily.    . Cholecalciferol (VITAMIN D3) 1000 UNITS CAPS Take 1 capsule by mouth daily.    . insulin glargine (LANTUS) 100 UNIT/ML Solostar Pen Inject 35 Units into the skin daily.    Marland Kitchen liraglutide (VICTOZA) 18 MG/3ML SOPN Inject 185 mg into the skin daily.    Marland Kitchen lisinopril (ZESTRIL) 20 MG tablet     . pantoprazole (PROTONIX) 20 MG tablet Take 20 mg by mouth daily.    . rosuvastatin (CRESTOR) 5 MG tablet Take 2.5 mg by mouth daily.      No current facility-administered medications on file prior to visit.    Cardiovascular and other pertinent studies:  Echocardiogram 10/11/2019:  1. Normal LV systolic function with visual EF 55-60%. Left ventricle  cavity is normal in size. Moderate concentric  hypertrophy of the left  ventricle. Normal global wall motion. Normal diastolic filling pattern.  2. Trace mitral regurgitation. Mild calcification of the mitral valve  annulus.  3. Structurally normal tricuspid valve. Mild tricuspid regurgitation. No  evidence of pulmonary hypertension.  ABI 10/11/2019:  This exam reveals normal perfusion of the right lower extremity (ABI 1.00)  and normal perfusion of the left lower extremity (ABI 1.13). Monophasic  waveform in both AT and PT suggests diffuse disease.  ABI may be falsely elevated in diabetic patients due to medial sclerosis.  EKG 09/29/2019: Sinus tachycardia 104 bpm.  Nonspecific ST-T changes.   Recent labs: 09/21/2019: Glucose 153, BUN/Cr 25/1.9. EGFR 42. Na/K 139/4.3. Rest of the CMP normal H/H 11/36. MCV 96. Platelets 253 HbA1C 6.6% Chol 114, TG 57, HDL 34, LDL 69 TSH 1.0 normal   Review of Systems  Cardiovascular: Positive for chest pain. Negative for dyspnea on exertion, leg swelling, palpitations and syncope.        Vitals:   10/26/19 0952  BP: 132/68  Pulse: (!) 108     Body mass index is 26.22 kg/m. Filed Weights   10/26/19 0952  Weight: 188 lb (85.3 kg)     Objective:   Physical Exam  Not performed. Telephone visit.      Assessment & Recommendations:   74 y.o. African American male with hypertension, hyperlipidemia, type  2 DM, CKD stage 3b, referred for evaluation of chest pain.  Chest pain: While he has risk factors, his chest pain is quite atypical. He has continued to have symptoms in spite PPI therapy. Given his risk factors, will proceed with exercise nuclear stress test. Unless severe abnormalities found, I would still recommend medical therapy first.   Recommend metoprolol tartarate 25 mg bid, to be started after the stress test. Patient will inform of [place] about his mail order pharmacy, where 180 pillsX2 will then be sent.   Abnormal vascular exam Monophasic b/l PT waveform with falsely  elevated ABI. No claudication symptoms or critical limb ischemia at this point.  Continue medical management.  F/u in 6 weeks.    Nigel Mormon, MD [facility] Pager: [phone] Office: [phone]

## 2019-10-26 ENCOUNTER — Telehealth: Payer: Medicare Other | Admitting: Cardiology

## 2019-10-26 ENCOUNTER — Encounter: Payer: Self-pay | Admitting: Cardiology

## 2019-10-26 ENCOUNTER — Other Ambulatory Visit: Payer: Self-pay

## 2019-10-26 VITALS — BP 132/68 | HR 108 | Ht 71.0 in | Wt 188.0 lb

## 2019-10-26 DIAGNOSIS — R0789 Other chest pain: Secondary | ICD-10-CM | POA: Diagnosis not present

## 2019-10-26 DIAGNOSIS — R0989 Other specified symptoms and signs involving the circulatory and respiratory systems: Secondary | ICD-10-CM

## 2019-11-07 ENCOUNTER — Ambulatory Visit: Payer: Medicare Other

## 2019-11-07 ENCOUNTER — Other Ambulatory Visit: Payer: Self-pay

## 2019-11-07 DIAGNOSIS — R0789 Other chest pain: Secondary | ICD-10-CM | POA: Diagnosis not present

## 2019-11-14 NOTE — Progress Notes (Signed)
Called patient, NA

## 2019-11-14 NOTE — Progress Notes (Signed)
Normal stress test. Please keep appointment with MP coming up soon

## 2019-11-16 NOTE — Progress Notes (Signed)
2nd attempt: Called patient, na, no Vmbox to leave a message.

## 2019-11-23 DIAGNOSIS — L600 Ingrowing nail: Secondary | ICD-10-CM | POA: Diagnosis not present

## 2019-11-23 DIAGNOSIS — M25775 Osteophyte, left foot: Secondary | ICD-10-CM | POA: Diagnosis not present

## 2019-12-07 DIAGNOSIS — L600 Ingrowing nail: Secondary | ICD-10-CM | POA: Diagnosis not present

## 2019-12-21 DIAGNOSIS — L600 Ingrowing nail: Secondary | ICD-10-CM | POA: Diagnosis not present

## 2019-12-21 DIAGNOSIS — B9689 Other specified bacterial agents as the cause of diseases classified elsewhere: Secondary | ICD-10-CM | POA: Diagnosis not present

## 2019-12-21 DIAGNOSIS — Z1611 Resistance to penicillins: Secondary | ICD-10-CM | POA: Diagnosis not present

## 2019-12-21 DIAGNOSIS — Z1612 Extended spectrum beta lactamase (ESBL) resistance: Secondary | ICD-10-CM | POA: Diagnosis not present

## 2019-12-21 DIAGNOSIS — M19072 Primary osteoarthritis, left ankle and foot: Secondary | ICD-10-CM | POA: Diagnosis not present

## 2019-12-21 DIAGNOSIS — Z1629 Resistance to other single specified antibiotic: Secondary | ICD-10-CM | POA: Diagnosis not present

## 2019-12-28 DIAGNOSIS — L600 Ingrowing nail: Secondary | ICD-10-CM | POA: Diagnosis not present

## 2020-01-06 ENCOUNTER — Other Ambulatory Visit: Payer: Self-pay

## 2020-01-06 ENCOUNTER — Ambulatory Visit: Payer: Medicare Other | Admitting: Cardiology

## 2020-01-06 ENCOUNTER — Ambulatory Visit: Payer: Medicare Other

## 2020-01-06 ENCOUNTER — Encounter: Payer: Self-pay | Admitting: Cardiology

## 2020-01-06 VITALS — BP 139/74 | HR 104 | Resp 17 | Ht 71.0 in | Wt 182.0 lb

## 2020-01-06 DIAGNOSIS — R000 Tachycardia, unspecified: Secondary | ICD-10-CM

## 2020-01-06 DIAGNOSIS — R0789 Other chest pain: Secondary | ICD-10-CM

## 2020-01-06 DIAGNOSIS — R0989 Other specified symptoms and signs involving the circulatory and respiratory systems: Secondary | ICD-10-CM

## 2020-01-06 NOTE — Progress Notes (Signed)
Patient referred by Velna Hatchet, MD for chest pain  Subjective:   Miguel Powers, male    DOB: 06/02/1946, 74 y.o.   MRN: [id number]   Chief Complaint  Patient presents with  . Chest Pain  . Follow-up    HPI  74 y.o. African American male with hypertension, hyperlipidemia, type 2 DM, CKD stage 3b, referred for evaluation of chest pain.  Stress test showed no ischemia. EF is normal. He has had improvement in chest pain after avoiding certain foods.   Current Outpatient Medications on File Prior to Visit  Medication Sig Dispense Refill  . amLODipine (NORVASC) 10 MG tablet Take 10 mg by mouth every morning.     Marland Kitchen aspirin 81 MG tablet Take 81 mg by mouth daily.    . Cholecalciferol (VITAMIN D3) 1000 UNITS CAPS Take 1 capsule by mouth daily.    . insulin glargine (LANTUS) 100 UNIT/ML Solostar Pen Inject 35 Units into the skin daily.    Marland Kitchen liraglutide (VICTOZA) 18 MG/3ML SOPN Inject 185 mg into the skin daily.    Marland Kitchen lisinopril (ZESTRIL) 20 MG tablet     . pantoprazole (PROTONIX) 20 MG tablet Take 20 mg by mouth daily.    . Rosuvastatin Calcium 10 MG CPSP Take 5 mg by mouth daily.      No current facility-administered medications on file prior to visit.    Cardiovascular and other pertinent studies:  Lexiscan (Walking with mod Bruce)Tetrofosmin Stress Test  11/07/2019: Nondiagnostic ECG stress. Mod Bruce protocol and patient achieved THR. Non specific inferolateral 1 mm upsloping ST change at peak exercise.  Nuclear perfusion is normal without ischemia.  Overall LV systolic function is normal without regional wall motion abnormalities. Stress LV EF is mildly dysfunctional 49% although visually  appears normal.  Stress LV EF: 49%.  No previous exam available for comparison. Low risk.   Echocardiogram 10/11/2019:  1. Normal LV systolic function with visual EF 55-60%. Left ventricle  cavity is normal in size. Moderate concentric hypertrophy of the left  ventricle. Normal  global wall motion. Normal diastolic filling pattern.  2. Trace mitral regurgitation. Mild calcification of the mitral valve  annulus.  3. Structurally normal tricuspid valve. Mild tricuspid regurgitation. No  evidence of pulmonary hypertension.  ABI 10/11/2019:  This exam reveals normal perfusion of the right lower extremity (ABI 1.00)  and normal perfusion of the left lower extremity (ABI 1.13). Monophasic  waveform in both AT and PT suggests diffuse disease.  ABI may be falsely elevated in diabetic patients due to medial sclerosis.  EKG 09/29/2019: Sinus tachycardia 104 bpm.  Nonspecific ST-T changes.   Recent labs: 09/21/2019: Glucose 153, BUN/Cr 25/1.9. EGFR 42. Na/K 139/4.3. Rest of the CMP normal H/H 11/36. MCV 96. Platelets 253 HbA1C 6.6% Chol 114, TG 57, HDL 34, LDL 69 TSH 1.0 normal   Review of Systems  Cardiovascular: Positive for chest pain. Negative for dyspnea on exertion, leg swelling, palpitations and syncope.        Vitals:   01/06/20 1451  BP: 139/74  Pulse: (!) 104  Resp: 17  SpO2: 97%     Body mass index is 25.38 kg/m. Filed Weights   01/06/20 1451  Weight: 182 lb (82.6 kg)     Objective:   Physical Exam Vitals and nursing note reviewed.  Constitutional:      General: He is not in acute distress. Neck:     Vascular: No JVD.  Cardiovascular:     Rate and  Rhythm: Regular rhythm. Tachycardia present.     Pulses: Intact distal pulses.     Heart sounds: Normal heart sounds. No murmur heard.   Pulmonary:     Effort: Pulmonary effort is normal.     Breath sounds: Normal breath sounds. No wheezing or rales.          Assessment & Recommendations:   74 y.o. African American male with hypertension, hyperlipidemia, type 2 DM, CKD stage 3b, chest pain.  Chest pain: Low risk stress test. Likely non-agninal pain. Continue Aspirin, statin,  Abnormal vascular exam Monophasic b/l PT waveform with falsely elevated ABI. No claudication  symptoms or critical limb ischemia at this point.  Continue medical management.  Resting tachycardia: Will check Holter monitor.  Follow up as needed    Nigel Mormon, MD [facility] Pager: [phone] Office: [phone]

## 2020-01-26 DIAGNOSIS — Z794 Long term (current) use of insulin: Secondary | ICD-10-CM | POA: Diagnosis not present

## 2020-01-26 DIAGNOSIS — E1169 Type 2 diabetes mellitus with other specified complication: Secondary | ICD-10-CM | POA: Diagnosis not present

## 2020-01-26 DIAGNOSIS — R0789 Other chest pain: Secondary | ICD-10-CM | POA: Diagnosis not present

## 2020-01-26 DIAGNOSIS — N1832 Chronic kidney disease, stage 3b: Secondary | ICD-10-CM | POA: Diagnosis not present

## 2020-01-26 DIAGNOSIS — I129 Hypertensive chronic kidney disease with stage 1 through stage 4 chronic kidney disease, or unspecified chronic kidney disease: Secondary | ICD-10-CM | POA: Diagnosis not present

## 2020-02-17 ENCOUNTER — Other Ambulatory Visit: Payer: Self-pay

## 2020-02-17 ENCOUNTER — Ambulatory Visit: Payer: Medicare Other | Admitting: Cardiology

## 2020-02-17 ENCOUNTER — Encounter: Payer: Self-pay | Admitting: Cardiology

## 2020-02-17 VITALS — BP 163/87 | HR 99 | Resp 17 | Ht 71.0 in | Wt 189.0 lb

## 2020-02-17 DIAGNOSIS — R000 Tachycardia, unspecified: Secondary | ICD-10-CM

## 2020-02-17 DIAGNOSIS — I739 Peripheral vascular disease, unspecified: Secondary | ICD-10-CM

## 2020-02-17 DIAGNOSIS — I10 Essential (primary) hypertension: Secondary | ICD-10-CM | POA: Insufficient documentation

## 2020-02-17 NOTE — Progress Notes (Signed)
Patient referred by Velna Hatchet, MD for chest pain  Subjective:   Miguel Powers, male    DOB: 1945/09/20, 74 y.o.   MRN: [id number]   Chief Complaint  Patient presents with  . Tachycardia  . Chest Pain  . Follow-up    6 week    HPI  74 y.o. African American male with hypertension, hyperlipidemia, type 2 DM, CKD stage 3b, referred for evaluation of chest pain.  Stress test showed no ischemia. EF is normal. He has had improvement in chest pain after avoiding certain foods.   Holter monitor showed resting tachycardia with average heart rate of 106 bpm. Blood pressure higher than usual. HR 99 bpm.   He denies any claudication symptoms. He recently had his ingrown toenail removed, which healed well.   Current Outpatient Medications on File Prior to Visit  Medication Sig Dispense Refill  . amLODipine (NORVASC) 10 MG tablet Take 10 mg by mouth every morning.     Marland Kitchen aspirin 81 MG tablet Take 81 mg by mouth daily.    . Cholecalciferol (VITAMIN D3) 1000 UNITS CAPS Take 1 capsule by mouth daily.    . insulin glargine (LANTUS) 100 UNIT/ML Solostar Pen Inject 35 Units into the skin daily.    Marland Kitchen liraglutide (VICTOZA) 18 MG/3ML SOPN Inject 185 mg into the skin daily.    Marland Kitchen lisinopril (ZESTRIL) 20 MG tablet     . Rosuvastatin Calcium 10 MG CPSP Take 5 mg by mouth daily.      No current facility-administered medications on file prior to visit.    Cardiovascular and other pertinent studies:  24 hr Holter monitor 01/06/2020: Dominant rhythm sinus. HR 92-141 bpm. Avg HR 106 bpm. <1% PVC's No atrial fibrillation/atrial flutter/SVT/VT/high grade AV block, sinus pause >3sec noted. Symptoms reported: None   Lexiscan (Walking with mod Bruce)Tetrofosmin Stress Test  11/07/2019: Nondiagnostic ECG stress. Mod Bruce protocol and patient achieved THR. Non specific inferolateral 1 mm upsloping ST change at peak exercise.  Nuclear perfusion is normal without ischemia.  Overall LV systolic  function is normal without regional wall motion abnormalities. Stress LV EF is mildly dysfunctional 49% although visually  appears normal.  Stress LV EF: 49%.  No previous exam available for comparison. Low risk.   Echocardiogram 10/11/2019:  1. Normal LV systolic function with visual EF 55-60%. Left ventricle  cavity is normal in size. Moderate concentric hypertrophy of the left  ventricle. Normal global wall motion. Normal diastolic filling pattern.  2. Trace mitral regurgitation. Mild calcification of the mitral valve  annulus.  3. Structurally normal tricuspid valve. Mild tricuspid regurgitation. No  evidence of pulmonary hypertension.  ABI 10/11/2019:  This exam reveals normal perfusion of the right lower extremity (ABI 1.00)  and normal perfusion of the left lower extremity (ABI 1.13). Monophasic  waveform in both AT and PT suggests diffuse disease.  ABI may be falsely elevated in diabetic patients due to medial sclerosis.  EKG 09/29/2019: Sinus tachycardia 104 bpm.  Nonspecific ST-T changes.   Recent labs: 09/21/2019: Glucose 153, BUN/Cr 25/1.9. EGFR 42. Na/K 139/4.3. Rest of the CMP normal H/H 11/36. MCV 96. Platelets 253 HbA1C 6.6% Chol 114, TG 57, HDL 34, LDL 69 TSH 1.0 normal   Review of Systems  Cardiovascular: Positive for chest pain. Negative for dyspnea on exertion, leg swelling, palpitations and syncope.        Vitals:   02/17/20 0923  BP: (!) 163/87  Pulse: 99  Resp: 17  SpO2: 98%  Body mass index is 26.36 kg/m. Filed Weights   02/17/20 0923  Weight: 189 lb (85.7 kg)     Objective:   Physical Exam Vitals and nursing note reviewed.  Constitutional:      General: He is not in acute distress. Neck:     Vascular: No JVD.  Cardiovascular:     Rate and Rhythm: Normal rate and regular rhythm.     Pulses: Intact distal pulses.     Heart sounds: Normal heart sounds. No murmur heard.   Pulmonary:     Effort: Pulmonary effort is normal.      Breath sounds: Normal breath sounds. No wheezing or rales.          Assessment & Recommendations:   73 y.o. African American male with hypertension, hyperlipidemia, type 2 DM, CKD stage 3b, chest pain.  Chest pain: Low risk stress test. Likely non-agninal pain. Continue Aspirin, statin,  Abnormal vascular exam Monophasic b/l PT waveform with falsely elevated ABI. No claudication symptoms or critical limb ischemia at this point.  Continue medical management.  Resting tachycardia, hypertension: I suggested adding beta blocker such as metoprolol succinate 50 mg. Patient would like to hold off for now. Okay with me. Continue monitoring HR BP at home regular.y If BP consistently stays >140/80 mmHg, he knows to reach out to me or his PCP.   F/u in 6 months    Manish J Patwardhan, MD [facility] Pager: [phone] Office: [phone] 

## 2020-08-20 ENCOUNTER — Encounter: Payer: Self-pay | Admitting: Cardiology

## 2020-08-20 ENCOUNTER — Other Ambulatory Visit: Payer: Self-pay

## 2020-08-20 ENCOUNTER — Ambulatory Visit: Payer: Medicare Other | Admitting: Cardiology

## 2020-08-20 VITALS — BP 135/77 | HR 102 | Temp 98.1°F | Resp 16 | Ht 71.0 in | Wt 181.0 lb

## 2020-08-20 DIAGNOSIS — R000 Tachycardia, unspecified: Secondary | ICD-10-CM

## 2020-08-20 DIAGNOSIS — I739 Peripheral vascular disease, unspecified: Secondary | ICD-10-CM | POA: Diagnosis not present

## 2020-08-20 DIAGNOSIS — I10 Essential (primary) hypertension: Secondary | ICD-10-CM

## 2020-08-20 DIAGNOSIS — K219 Gastro-esophageal reflux disease without esophagitis: Secondary | ICD-10-CM | POA: Diagnosis not present

## 2020-08-20 DIAGNOSIS — R0789 Other chest pain: Secondary | ICD-10-CM

## 2020-08-20 MED ORDER — OMEPRAZOLE 20 MG PO CPDR: 20.0000 mg | DELAYED_RELEASE_CAPSULE | Freq: Every day | ORAL | 2 refills | Status: AC

## 2020-08-20 NOTE — Progress Notes (Signed)
Patient referred by Velna Hatchet, MD for chest pain  Subjective:   Miguel Powers, male    DOB: [birth year], 75 y.o.   MRN: [id number]   Chief Complaint  Patient presents with  . Tachycardia  . PAD  . Follow-up    6 month  . Hypertension    HPI  75 y.o. African American male with hypertension, hyperlipidemia, type 2 DM, CKD stage 3b, referred for evaluation of chest pain.  He continues to have retrosternal chest pain, only after eating and laying down. He doe snot have any exertional chest pain.   Stress test in 10/2019 showed no ischemia. EF is normal.   Holter monitor showed resting tachycardia with average heart rate of 106 bpm. Blood pressure higher than usual. HR 99 bpm.   He denies any claudication symptoms, nonhealing wounds.,  BP elevated today, states that it is 130s/80 at home.   Current Outpatient Medications on File Prior to Visit  Medication Sig Dispense Refill  . amLODipine (NORVASC) 10 MG tablet Take 10 mg by mouth every morning.     Marland Kitchen aspirin 81 MG tablet Take 81 mg by mouth daily.    . Cholecalciferol (VITAMIN D3) 1000 UNITS CAPS Take 1 capsule by mouth daily.    . insulin glargine (LANTUS) 100 UNIT/ML Solostar Pen Inject 35 Units into the skin daily.    Marland Kitchen liraglutide (VICTOZA) 18 MG/3ML SOPN Inject 185 mg into the skin daily.    Marland Kitchen lisinopril (ZESTRIL) 20 MG tablet     . Rosuvastatin Calcium 10 MG CPSP Take 5 mg by mouth daily.      No current facility-administered medications on file prior to visit.    Cardiovascular and other pertinent studies:  EKG 08/20/2020: Sinus tachycardia 101 bpm  Nonspecific ST-T abnormality  24 hr Holter monitor 01/06/2020: Dominant rhythm sinus. HR 92-141 bpm. Avg HR 106 bpm. <1% PVC's No atrial fibrillation/atrial flutter/SVT/VT/high grade AV block, sinus pause >3sec noted. Symptoms reported: None   Lexiscan (Walking with mod Bruce)Tetrofosmin Stress Test  11/07/2019: Nondiagnostic ECG stress. Mod Bruce  protocol and patient achieved THR. Non specific inferolateral 1 mm upsloping ST change at peak exercise.  Nuclear perfusion is normal without ischemia.  Overall LV systolic function is normal without regional wall motion abnormalities. Stress LV EF is mildly dysfunctional 49% although visually  appears normal.  Stress LV EF: 49%.  No previous exam available for comparison. Low risk.   Echocardiogram 10/11/2019:  1. Normal LV systolic function with visual EF 55-60%. Left ventricle  cavity is normal in size. Moderate concentric hypertrophy of the left  ventricle. Normal global wall motion. Normal diastolic filling pattern.  2. Trace mitral regurgitation. Mild calcification of the mitral valve  annulus.  3. Structurally normal tricuspid valve. Mild tricuspid regurgitation. No  evidence of pulmonary hypertension.  ABI 10/11/2019:  This exam reveals normal perfusion of the right lower extremity (ABI 1.00)  and normal perfusion of the left lower extremity (ABI 1.13). Monophasic  waveform in both AT and PT suggests diffuse disease.  ABI may be falsely elevated in diabetic patients due to medial sclerosis.  EKG 09/29/2019: Sinus tachycardia 104 bpm.  Nonspecific ST-T changes.   Recent labs: 09/21/2019: Glucose 153, BUN/Cr 25/1.9. EGFR 42. Na/K 139/4.3. Rest of the CMP normal H/H 11/36. MCV 96. Platelets 253 HbA1C 6.6% Chol 114, TG 57, HDL 34, LDL 69 TSH 1.0 normal   Review of Systems  Cardiovascular: Positive for chest pain. Negative for dyspnea on exertion, leg  swelling, palpitations and syncope.        Vitals:   08/20/20 0930 08/20/20 0942  BP: (!) 154/113 135/77  Pulse: 98 (!) 102  Resp: 16   Temp: 98.1 F (36.7 C)   SpO2: 97%      Body mass index is 25.24 kg/m. Filed Weights   08/20/20 0930  Weight: 181 lb (82.1 kg)     Objective:   Physical Exam Vitals and nursing note reviewed.  Constitutional:      General: He is not in acute distress. Neck:      Vascular: No JVD.  Cardiovascular:     Rate and Rhythm: Normal rate and regular rhythm.     Pulses: Intact distal pulses.     Heart sounds: Normal heart sounds. No murmur heard.   Pulmonary:     Effort: Pulmonary effort is normal.     Breath sounds: Normal breath sounds. No wheezing or rales.          Assessment & Recommendations:   75 y.o. African American male with hypertension, hyperlipidemia, type 2 DM, CKD stage 3b, chest pain.  Chest pain: Low risk stress test. Likely GERD related pain, occurs only after meals Trial of prilosec. If no improvement, may need GI evaluation. Continue Aspirin, statin,  Abnormal vascular exam Monophasic b/l PT waveform with falsely elevated ABI. No claudication symptoms or critical limb ischemia at this point.  Continue medical management.  Hypertension: Recommend home monitoring. Will re-evaluate at next visit  F/u in 4 weeks   Manish Esther Hardy, MD [facility] Pager: [phone] Office: [phone]

## 2020-09-27 ENCOUNTER — Ambulatory Visit: Payer: Medicare Other | Admitting: Cardiology

## 2020-10-02 DIAGNOSIS — E1165 Type 2 diabetes mellitus with hyperglycemia: Secondary | ICD-10-CM | POA: Diagnosis not present

## 2020-10-02 DIAGNOSIS — Z125 Encounter for screening for malignant neoplasm of prostate: Secondary | ICD-10-CM | POA: Diagnosis not present

## 2020-10-02 DIAGNOSIS — E785 Hyperlipidemia, unspecified: Secondary | ICD-10-CM | POA: Diagnosis not present

## 2020-10-09 DIAGNOSIS — Z0000 Encounter for general adult medical examination without abnormal findings: Secondary | ICD-10-CM | POA: Diagnosis not present

## 2020-10-09 DIAGNOSIS — Z794 Long term (current) use of insulin: Secondary | ICD-10-CM | POA: Diagnosis not present

## 2020-10-09 DIAGNOSIS — Z1331 Encounter for screening for depression: Secondary | ICD-10-CM | POA: Diagnosis not present

## 2020-10-09 DIAGNOSIS — E559 Vitamin D deficiency, unspecified: Secondary | ICD-10-CM | POA: Diagnosis not present

## 2020-10-09 DIAGNOSIS — N1832 Chronic kidney disease, stage 3b: Secondary | ICD-10-CM | POA: Diagnosis not present

## 2020-10-09 DIAGNOSIS — E1169 Type 2 diabetes mellitus with other specified complication: Secondary | ICD-10-CM | POA: Diagnosis not present

## 2020-10-09 DIAGNOSIS — K219 Gastro-esophageal reflux disease without esophagitis: Secondary | ICD-10-CM | POA: Diagnosis not present

## 2020-10-09 DIAGNOSIS — Z1339 Encounter for screening examination for other mental health and behavioral disorders: Secondary | ICD-10-CM | POA: Diagnosis not present

## 2020-10-09 DIAGNOSIS — Z8546 Personal history of malignant neoplasm of prostate: Secondary | ICD-10-CM | POA: Diagnosis not present

## 2020-10-09 DIAGNOSIS — R82998 Other abnormal findings in urine: Secondary | ICD-10-CM | POA: Diagnosis not present

## 2020-10-09 DIAGNOSIS — I10 Essential (primary) hypertension: Secondary | ICD-10-CM | POA: Diagnosis not present

## 2020-10-09 DIAGNOSIS — E785 Hyperlipidemia, unspecified: Secondary | ICD-10-CM | POA: Diagnosis not present

## 2020-10-09 DIAGNOSIS — R0789 Other chest pain: Secondary | ICD-10-CM | POA: Diagnosis not present

## 2020-10-11 DIAGNOSIS — Z1212 Encounter for screening for malignant neoplasm of rectum: Secondary | ICD-10-CM | POA: Diagnosis not present

## 2020-10-12 NOTE — Progress Notes (Signed)
Patient referred by Velna Hatchet, MD for chest pain  Subjective:   Miguel Powers, male    DOB: [birth year], 75 y.o.   MRN: [id number]   Chief Complaint  Patient presents with  . Chest Pain  . Follow-up    HPI  75 y.o. African American male with hypertension, hyperlipidemia, type 2 DM, CKD stage 3b, referred for evaluation of chest pain.  He has not had any pain while on prilosec. Stress test in 10/2019 showed no ischemia. EF is normal.  Heart rate remains elevated.  Blood pressures well controlled.   Current Outpatient Medications on File Prior to Visit  Medication Sig Dispense Refill  . amLODipine (NORVASC) 10 MG tablet Take 10 mg by mouth every morning.     Marland Kitchen aspirin 81 MG tablet Take 81 mg by mouth daily.    . Cholecalciferol (VITAMIN D3) 1000 UNITS CAPS Take 1 capsule by mouth daily.    . insulin glargine (LANTUS) 100 UNIT/ML Solostar Pen Inject 35 Units into the skin daily.    Marland Kitchen liraglutide (VICTOZA) 18 MG/3ML SOPN Inject 185 mg into the skin daily.    Marland Kitchen lisinopril (ZESTRIL) 20 MG tablet     . omeprazole (PRILOSEC) 20 MG capsule Take 1 capsule (20 mg total) by mouth daily. 30 capsule 2  . Rosuvastatin Calcium 10 MG CPSP Take 5 mg by mouth daily.      No current facility-administered medications on file prior to visit.    Cardiovascular and other pertinent studies:  EKG 08/20/2020: Sinus tachycardia 101 bpm  Nonspecific ST-T abnormality  24 hr Holter monitor 01/06/2020: Dominant rhythm sinus. HR 92-141 bpm. Avg HR 106 bpm. <1% PVC's No atrial fibrillation/atrial flutter/SVT/VT/high grade AV block, sinus pause >3sec noted. Symptoms reported: None   Lexiscan (Walking with mod Bruce)Tetrofosmin Stress Test  11/07/2019: Nondiagnostic ECG stress. Mod Bruce protocol and patient achieved THR. Non specific inferolateral 1 mm upsloping ST change at peak exercise.  Nuclear perfusion is normal without ischemia.  Overall LV systolic function is normal without regional  wall motion abnormalities. Stress LV EF is mildly dysfunctional 49% although visually  appears normal.  Stress LV EF: 49%.  No previous exam available for comparison. Low risk.   Echocardiogram 10/11/2019:  1. Normal LV systolic function with visual EF 55-60%. Left ventricle  cavity is normal in size. Moderate concentric hypertrophy of the left  ventricle. Normal global wall motion. Normal diastolic filling pattern.  2. Trace mitral regurgitation. Mild calcification of the mitral valve  annulus.  3. Structurally normal tricuspid valve. Mild tricuspid regurgitation. No  evidence of pulmonary hypertension.  ABI 10/11/2019:  This exam reveals normal perfusion of the right lower extremity (ABI 1.00)  and normal perfusion of the left lower extremity (ABI 1.13). Monophasic  waveform in both AT and PT suggests diffuse disease.  ABI may be falsely elevated in diabetic patients due to medial sclerosis.  Recent labs: 09/21/2019: Glucose 153, BUN/Cr 25/1.9. EGFR 42. Na/K 139/4.3. Rest of the CMP normal H/H 11/36. MCV 96. Platelets 253 HbA1C 6.6% Chol 114, TG 57, HDL 34, LDL 69 TSH 1.0 normal   Review of Systems  Cardiovascular: Positive for chest pain. Negative for dyspnea on exertion, leg swelling, palpitations and syncope.        Vitals:   10/15/20 0823  BP: 139/86  Pulse: 100  Temp: 98.5 F (36.9 C)     Body mass index is 25.24 kg/m. Filed Weights   10/15/20 0823  Weight: 181 lb (82.1  kg)     Objective:   Physical Exam Vitals and nursing note reviewed.  Constitutional:      General: He is not in acute distress. Neck:     Vascular: No JVD.  Cardiovascular:     Rate and Rhythm: Normal rate and regular rhythm.     Pulses: Intact distal pulses.     Heart sounds: Normal heart sounds. No murmur heard.   Pulmonary:     Effort: Pulmonary effort is normal.     Breath sounds: Normal breath sounds. No wheezing or rales.          Assessment & Recommendations:   75  y.o. African American male with hypertension, hyperlipidemia, type 2 DM, CKD stage 3b, chest pain.  Chest pain: Low risk stress test. Likely GERD related pain, occurs only after meals Trial of prilosec as well.  Nonetheless, I would suggest seeing GI to establish etiology of his pain.  Defer to Dr. Ardeth Perfect. Continue Aspirin, statin,  Resting tachycardia: Unclear etiology.  No symptoms related to this.  He is directed to start beta-blocker at this time.  Abnormal vascular exam Monophasic b/l PT waveform with falsely elevated ABI. No claudication symptoms or critical limb ischemia at this point.  Continue medical management.  Hypertension: Recommend home monitoring. Will re-evaluate at next visit   F/u in 6 months  [place], MD [facility] Pager: [phone] Office: [phone]

## 2020-10-15 ENCOUNTER — Other Ambulatory Visit: Payer: Self-pay

## 2020-10-15 ENCOUNTER — Ambulatory Visit: Payer: Medicare Other | Admitting: Cardiology

## 2020-10-15 ENCOUNTER — Encounter: Payer: Self-pay | Admitting: Cardiology

## 2020-10-15 VITALS — BP 139/86 | HR 100 | Temp 98.5°F | Ht 71.0 in | Wt 181.0 lb

## 2020-10-15 DIAGNOSIS — R000 Tachycardia, unspecified: Secondary | ICD-10-CM | POA: Diagnosis not present

## 2020-10-15 DIAGNOSIS — I739 Peripheral vascular disease, unspecified: Secondary | ICD-10-CM

## 2020-10-15 DIAGNOSIS — R0789 Other chest pain: Secondary | ICD-10-CM | POA: Diagnosis not present

## 2020-10-15 DIAGNOSIS — I10 Essential (primary) hypertension: Secondary | ICD-10-CM | POA: Diagnosis not present

## 2020-11-12 NOTE — Progress Notes (Signed)
History of Present Illness: Here for follow-up of history of prostate cancer.  10.1.2015--he underwent I-125 brachytherapy.  Initial diagnosis in July 2015.  PSA 7.68, PSA density 0.256.  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  Most recent PSA was from September 2020, 0.1.  IPSS Questionnaire (AUA-7): Over the past month.   1)  How often have you had a sensation of not emptying your bladder completely after you finish urinating?  1 - Less than 1 time in 5  2)  How often have you had to urinate again less than two hours after you finished urinating? 2 - Less than half the time  3)  How often have you found you stopped and started again several times when you urinated?  0 - Not at all  4) How difficult have you found it to postpone urination?  2 - Less than half the time  5) How often have you had a weak urinary stream?  1 - Less than 1 time in 5  6) How often have you had to push or strain to begin urination?  0 - Not at all  7) How many times did you most typically get up to urinate from the time you went to bed until the time you got up in the morning?  3 - 3 times  Total score:  0-7 mildly symptomatic   8-19 moderately symptomatic   20-35 severely symptomatic   5.3.2022: IPSS 9, quality-of-life score 2.  Not on any medication for BPH. Since his last visit here he has had no urinary complaints.  No blood in his urine or stool.  Overall he has a good stream.  He has sildenafil which he does not take very often.  Past Medical History:  Diagnosis Date  . Allergy    seasonal allergies 2016  . History of MRSA infection    1990's--  left abd.  . Hyperlipidemia   . Hypertension   . 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 Type 2 diabetes mellitus ([place])   . Wears glasses     Past Surgical History:  Procedure Laterality Date  . ABDOMINAL EXPLORATION SURGERY  1980's   resection abscess and irrigation  . APPENDECTOMY  1970's  . COLONOSCOPY  2004   [place]  -normal per pt   . PROSTATE BIOPSY    . RADIOACTIVE SEED IMPLANT N/A 04/13/2014   Procedure: RADIOACTIVE SEED IMPLANT;  Surgeon: Jorja Loa, MD;  [facility]: [facility];  Service: Urology;  Laterality: N/A;    Home Medications:  Allergies as of 11/13/2020      Reactions   Atorvastatin Other (See Comments)   Other reaction(s): Muscle pain   Metformin Other (See Comments)   Simvastatin Other (See Comments)   Other reaction(s): Muscle pain   Statins Other (See Comments)   Other reaction(s): Muscle pain   Empagliflozin    Other reaction(s): Serum creatinine raised      Medication List       Accurate as of Nov 12, 2020  7:29 PM. If you have any questions, ask your nurse or doctor.        amLODipine 10 MG tablet Commonly known as: NORVASC Take 10 mg by mouth every morning.   aspirin 81 MG tablet Take 81 mg by mouth daily.   insulin glargine 100 UNIT/ML Solostar Pen Commonly known as: LANTUS Inject 35 Units into the skin daily.   liraglutide 18 MG/3ML Sopn Commonly  known as: VICTOZA Inject 185 mg into the skin daily.   lisinopril 20 MG tablet Commonly known as: ZESTRIL   loratadine 10 MG tablet Commonly known as: CLARITIN Take 10 mg by mouth daily.   omeprazole 20 MG capsule Commonly known as: PRILOSEC Take 1 capsule (20 mg total) by mouth daily.   Rosuvastatin Calcium 10 MG Cpsp Take 5 mg by mouth daily.   sildenafil 50 MG tablet Commonly known as: VIAGRA Take 25 mg by mouth as needed.   traZODone 50 MG tablet Commonly known as: DESYREL Take 1 tablet by mouth daily.   Vitamin D3 25 MCG (1000 UT) Caps Take 1 capsule by mouth daily.       Allergies:  Allergies  Allergen Reactions  . Atorvastatin Other (See Comments)    Other reaction(s): Muscle pain  . Metformin Other (See Comments)  . Simvastatin Other (See Comments)    Other reaction(s): Muscle pain  . Statins Other (See Comments)    Other reaction(s): Muscle pain  .  Empagliflozin     Other reaction(s): Serum creatinine raised    Family History  Problem Relation Age of Onset  . Diabetes Mother   . Cancer Mother        lung  . Alzheimer's disease Mother   . Heart disease Father   . Hypertension Sister   . Diabetes Brother   . Hypertension Sister   . Diabetes Brother   . Colon cancer Neg Hx   . Esophageal cancer Neg Hx   . Rectal cancer Neg Hx   . Stomach cancer Neg Hx     Social History:  reports that he has never smoked. He has never used smokeless tobacco. He reports that he does not drink alcohol and does not use drugs.  ROS: A complete review of systems was performed.  All systems are negative except for pertinent findings as noted.  Physical Exam:  Vital signs in last 24 hours: There were no vitals taken for this visit. Constitutional:  Alert and oriented, No acute distress Cardiovascular: Regular rate  Respiratory: Normal respiratory effort GI: Abdomen is soft, nontender, nondistended, no abdominal masses. No CVAT.  Genitourinary: Normal male phallus (uncircumcised), testes are descended bilaterally and non-tender and without masses, scrotum is normal in appearance without lesions or masses, perineum is normal on inspection.  Prostate smooth, flat, small. Lymphatic: No lymphadenopathy Neurologic: Grossly intact, no focal deficits Psychiatric: Normal mood and affect  I have reviewed prior pt notes  I have reviewed notes from referring/previous physicians  I have reviewed urinalysis results  I have reviewed prior PSA results    Impression/Assessment:  History of prostate cancer, status post brachytherapy in 2015.  Inadequate follow-up recently, but at last check in September 2020, PSA was 0.1.  His exam was normal today, urinalysis clear  Plan:  I will have his PSA checked today  Will come back in 1 year for recheck

## 2020-11-13 ENCOUNTER — Ambulatory Visit (INDEPENDENT_AMBULATORY_CARE_PROVIDER_SITE_OTHER): Payer: Medicare Other | Admitting: Urology

## 2020-11-13 ENCOUNTER — Other Ambulatory Visit: Payer: Self-pay

## 2020-11-13 VITALS — BP 102/63 | HR 106 | Wt 182.0 lb

## 2020-11-13 DIAGNOSIS — C61 Malignant neoplasm of prostate: Secondary | ICD-10-CM

## 2020-11-13 DIAGNOSIS — N5201 Erectile dysfunction due to arterial insufficiency: Secondary | ICD-10-CM

## 2020-11-13 DIAGNOSIS — Z8546 Personal history of malignant neoplasm of prostate: Secondary | ICD-10-CM | POA: Diagnosis not present

## 2020-11-13 LAB — URINALYSIS, ROUTINE W REFLEX MICROSCOPIC
Bilirubin, UA: NEGATIVE
Glucose, UA: NEGATIVE
Leukocytes,UA: NEGATIVE
Nitrite, UA: NEGATIVE
Specific Gravity, UA: 1.02 (ref 1.005–1.030)
Urobilinogen, Ur: 1 mg/dL (ref 0.2–1.0)
pH, UA: 5.5 (ref 5.0–7.5)

## 2020-11-13 LAB — MICROSCOPIC EXAMINATION
Bacteria, UA: NONE SEEN
Renal Epithel, UA: NONE SEEN /hpf
WBC, UA: NONE SEEN /hpf (ref 0–5)

## 2020-11-13 NOTE — Progress Notes (Signed)
Urological Symptom Review  Patient is experiencing the following symptoms: none   Review of Systems  Gastrointestinal (upper)  : Negative for upper GI symptoms  Gastrointestinal (lower) : Negative for lower GI symptoms  Constitutional : Negative for symptoms  Skin: Negative for skin symptoms  Eyes: Negative for eye symptoms  Ear/Nose/Throat : Sinus problems  Hematologic/Lymphatic: Negative for Hematologic/Lymphatic symptoms  Cardiovascular : Negative for cardiovascular symptoms  Respiratory : Negative for respiratory symptoms  Endocrine: Negative for endocrine symptoms  Musculoskeletal: Negative for musculoskeletal symptoms  Neurological: Negative for neurological symptoms  Psychologic: Negative for psychiatric symptoms

## 2020-11-14 LAB — PSA: Prostate Specific Ag, Serum: <0.1 ng/mL (ref 0.0–4.0)

## 2020-11-21 NOTE — Progress Notes (Signed)
Letter sent via mail 

## 2021-04-15 NOTE — Progress Notes (Deleted)
Patient referred by Velna Hatchet, MD for chest pain  Subjective:   Miguel Powers, male    DOB: 01/19/1946, 75 y.o.   MRN: [id number]   No chief complaint on file.   HPI  75 y.o. African American male with hypertension, hyperlipidemia, type 2 DM, CKD stage 3b, referred for evaluation of chest pain.  *** He has not had any pain while on prilosec. Stress test in 10/2019 showed no ischemia. EF is normal.  Heart rate remains elevated.  Blood pressures well controlled.   Current Outpatient Medications on File Prior to Visit  Medication Sig Dispense Refill   amLODipine (NORVASC) 10 MG tablet Take 10 mg by mouth every morning.      aspirin 81 MG tablet Take 81 mg by mouth daily.     Cholecalciferol (VITAMIN D3) 1000 UNITS CAPS Take 1 capsule by mouth daily.     insulin glargine (LANTUS) 100 UNIT/ML Solostar Pen Inject 35 Units into the skin daily.     liraglutide (VICTOZA) 18 MG/3ML SOPN Inject 185 mg into the skin daily.     lisinopril (ZESTRIL) 20 MG tablet      loratadine (CLARITIN) 10 MG tablet Take 10 mg by mouth daily.     omeprazole (PRILOSEC) 20 MG capsule Take 1 capsule (20 mg total) by mouth daily. 30 capsule 2   Rosuvastatin Calcium 10 MG CPSP Take 5 mg by mouth daily.      sildenafil (VIAGRA) 50 MG tablet Take 25 mg by mouth as needed.     traZODone (DESYREL) 50 MG tablet Take 1 tablet by mouth daily.     No current facility-administered medications on file prior to visit.    Cardiovascular and other pertinent studies:  EKG 08/20/2020: Sinus tachycardia 101 bpm  Nonspecific ST-T abnormality  24 hr Holter monitor 01/06/2020: Dominant rhythm sinus. HR 92-141 bpm. Avg HR 106 bpm. <1% PVC's No atrial fibrillation/atrial flutter/SVT/VT/high grade AV block, sinus pause >3sec noted. Symptoms reported: None   Lexiscan (Walking with mod Bruce)Tetrofosmin Stress Test  11/07/2019: Nondiagnostic ECG stress. Mod Bruce protocol and patient achieved THR. Non specific  inferolateral 1 mm upsloping ST change at peak exercise.  Nuclear perfusion is normal without ischemia.  Overall LV systolic function is normal without regional wall motion abnormalities. Stress LV EF is mildly dysfunctional 49% although visually  appears normal.  Stress LV EF: 49%.  No previous exam available for comparison. Low risk.   Echocardiogram 10/11/2019:  1. Normal LV systolic function with visual EF 55-60%. Left ventricle  cavity is normal in size. Moderate concentric hypertrophy of the left  ventricle. Normal global wall motion. Normal diastolic filling pattern.  2. Trace mitral regurgitation. Mild calcification of the mitral valve  annulus.  3. Structurally normal tricuspid valve. Mild tricuspid regurgitation. No  evidence of pulmonary hypertension.  ABI 10/11/2019:  This exam reveals normal perfusion of the right lower extremity (ABI 1.00)  and normal perfusion of the left lower extremity (ABI 1.13).  Monophasic  waveform in both AT and PT suggests diffuse disease.  ABI may be falsely elevated in diabetic patients due to medial sclerosis.  Recent labs: 09/21/2019: Glucose 153, BUN/Cr 25/1.9. EGFR 42. Na/K 139/4.3. Rest of the CMP normal H/H 11/36. MCV 96. Platelets 253 HbA1C 6.6% Chol 114, TG 57, HDL 34, LDL 69 TSH 1.0 normal   Review of Systems  Cardiovascular:  Positive for chest pain. Negative for dyspnea on exertion, leg swelling, palpitations and syncope.  There were no vitals filed for this visit.    There is no height or weight on file to calculate BMI. There were no vitals filed for this visit.    Objective:   Physical Exam Vitals and nursing note reviewed.  Constitutional:      General: He is not in acute distress. Neck:     Vascular: No JVD.  Cardiovascular:     Rate and Rhythm: Normal rate and regular rhythm.     Pulses: Intact distal pulses.     Heart sounds: Normal heart sounds. No murmur heard. Pulmonary:     Effort: Pulmonary  effort is normal.     Breath sounds: Normal breath sounds. No wheezing or rales.         Assessment & Recommendations:   75 y.o. African American male with hypertension, hyperlipidemia, type 2 DM, CKD stage 3b, chest pain.  Chest pain: Low risk stress test (10/2019). Likely GERD related pain, occurs only after meals ***Trial of prilosec as well.  Nonetheless, I would suggest seeing GI to establish etiology of his pain.  Defer to Dr. Ardeth Perfect. Continue Aspirin, statin,  ***Resting tachycardia: Unclear etiology.  No symptoms related to this.  He is directed to start beta-blocker at this time.  ***Abnormal vascular exam Monophasic b/l PT waveform with falsely elevated ABI. No claudication symptoms or critical limb ischemia at this point.  Continue medical management.  ***Hypertension: Recommend home monitoring. Will re-evaluate at next visit   F/u in 6 months  [place], MD [facility] Pager: [phone] Office: [phone]

## 2021-04-17 ENCOUNTER — Ambulatory Visit: Payer: Medicare Other | Admitting: Cardiology

## 2021-11-18 NOTE — Progress Notes (Signed)
? ?History of Present Illness: Here for follow-up of history of prostate cancer. ?  ?10.1.2015--he underwent I-125 brachytherapy.  Initial diagnosis in July 2015.  PSA 7.68, PSA density 0.256.  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   ? ?5.9.2023: Here for routine check.  He has had no real urinary complaints over the past year except for persistent nocturnal frequency x3-4.  He does have sleep apnea but does not usually use CPAP.  When he does, this decreases his nocturia.  No gross hematuria.  No recent PSA that he is aware of although he did see Dr. Ardeth Perfect recently and had blood drawn.  IPSS 9, quality-of-life score 2 ? ? ? ?Past Medical History:  ?Diagnosis Date  ? Allergy   ? seasonal allergies 2016  ? History of MRSA infection   ? 1990's--  left abd.  ? Hyperlipidemia   ? Hypertension   ? Prostate cancer ([place])   ?  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 Type 2 diabetes mellitus ([place])   ? Wears glasses   ? ? ?Past Surgical History:  ?Procedure Laterality Date  ? ABDOMINAL EXPLORATION SURGERY  1980's  ? resection abscess and irrigation  ? APPENDECTOMY  1970's  ? COLONOSCOPY  2004  ? Rourk -normal per pt   ? PROSTATE BIOPSY    ? RADIOACTIVE SEED IMPLANT N/A 04/13/2014  ? Procedure: RADIOACTIVE SEED IMPLANT;  Surgeon: Jorja Loa, MD;  Location: [facility];  Service: Urology;  Laterality: N/A;  ? ? ?Home Medications:  ?Allergies as of 11/19/2021   ? ?   Reactions  ? Atorvastatin Other (See Comments)  ? Other reaction(s): Muscle pain  ? Metformin Other (See Comments)  ? Simvastatin Other (See Comments)  ? Other reaction(s): Muscle pain  ? Statins Other (See Comments)  ? Other reaction(s): Muscle pain  ? Empagliflozin   ? Other reaction(s): Serum creatinine raised  ? ?  ? ?  ?Medication List  ?  ? ?  ? Accurate as of Nov 18, 2021 11:14 AM. If you have any questions, ask your nurse or doctor.  ?  ?  ? ?  ? ?amLODipine 10 MG tablet ?Commonly known as: NORVASC ?Take 10 mg by mouth every  morning. ?  ?aspirin 81 MG tablet ?Take 81 mg by mouth daily. ?  ?insulin glargine 100 UNIT/ML Solostar Pen ?Commonly known as: LANTUS ?Inject 35 Units into the skin daily. ?  ?liraglutide 18 MG/3ML Sopn ?Commonly known as: VICTOZA ?Inject 185 mg into the skin daily. ?  ?lisinopril 20 MG tablet ?Commonly known as: ZESTRIL ?  ?loratadine 10 MG tablet ?Commonly known as: CLARITIN ?Take 10 mg by mouth daily. ?  ?omeprazole 20 MG capsule ?Commonly known as: PRILOSEC ?Take 1 capsule (20 mg total) by mouth daily. ?  ?Rosuvastatin Calcium 10 MG Cpsp ?Take 5 mg by mouth daily. ?  ?sildenafil 50 MG tablet ?Commonly known as: VIAGRA ?Take 25 mg by mouth as needed. ?  ?traZODone 50 MG tablet ?Commonly known as: DESYREL ?Take 1 tablet by mouth daily. ?  ?Vitamin D3 25 MCG (1000 UT) Caps ?Take 1 capsule by mouth daily. ?  ? ?  ? ? ?Allergies:  ?Allergies  ?Allergen Reactions  ? Atorvastatin Other (See Comments)  ?  Other reaction(s): Muscle pain  ? Metformin Other (See Comments)  ? Simvastatin Other (See Comments)  ?  Other reaction(s): Muscle pain  ? Statins Other (See Comments)  ?  Other reaction(s): Muscle pain  ?  Empagliflozin   ?  Other reaction(s): Serum creatinine raised  ? ? ?Family History  ?Problem Relation Age of Onset  ? Diabetes Mother   ? Cancer Mother   ?     lung  ? Alzheimer's disease Mother   ? Heart disease Father   ? Hypertension Sister   ? Diabetes Brother   ? Hypertension Sister   ? Diabetes Brother   ? Colon cancer Neg Hx   ? Esophageal cancer Neg Hx   ? Rectal cancer Neg Hx   ? Stomach cancer Neg Hx   ? ? ?Social History:  reports that he has never smoked. He has never used smokeless tobacco. He reports that he does not drink alcohol and does not use drugs. ? ?ROS: ?A complete review of systems was performed.  All systems are negative except for pertinent findings as noted. ? ?Physical Exam:  ?Vital signs in last 24 hours: ?There were no vitals taken for this visit. ?Constitutional:  Alert and oriented,  No acute distress ?Cardiovascular: Regular rate  ?Respiratory: Normal respiratory effort ?GI: Abdomen is soft, nontender, nondistended, no abdominal masses. No CVAT.  ?Genitourinary: Normal uncircumcised male phallus, testes are descended bilaterally and non-tender and without masses, scrotum is normal.  S normal anal sphincter tone, prostate smooth, flat without nodules. ?Lymphatic: No lymphadenopathy ?Neurologic: Grossly intact, no focal deficits ?Psychiatric: Normal mood and affect ? ?I have reviewed prior pt notes ? ?I have reviewed notes from referring/previous physicians ? ?I have reviewed prior PSA results ? ?Pathology reviewed ? ?IPSS reviewed ? ? ? ?Impression/Assessment:  ?History of prostate cancer, status post brachytherapy in 2015 with excellent results thus far ? ?Nocturia, probably multifactorial. ? ?Plan:  ?1.  I did told him to use his CPAP, this will probably limit his nocturia ? ?2.  We will call Dr. Hoover Brunette office to see if he has had recent PSA.  If not, I will have him drop in for that ? ?3.  I will see him back in a year for recheck ? ?

## 2021-11-19 ENCOUNTER — Ambulatory Visit (INDEPENDENT_AMBULATORY_CARE_PROVIDER_SITE_OTHER): Payer: Medicare PPO | Admitting: Urology

## 2021-11-19 ENCOUNTER — Encounter: Payer: Self-pay | Admitting: Urology

## 2021-11-19 VITALS — BP 126/68 | HR 108 | Wt 173.0 lb

## 2021-11-19 DIAGNOSIS — N5201 Erectile dysfunction due to arterial insufficiency: Secondary | ICD-10-CM

## 2021-11-19 DIAGNOSIS — R351 Nocturia: Secondary | ICD-10-CM

## 2021-11-19 DIAGNOSIS — Z8546 Personal history of malignant neoplasm of prostate: Secondary | ICD-10-CM | POA: Diagnosis not present

## 2022-10-27 DIAGNOSIS — I10 Essential (primary) hypertension: Secondary | ICD-10-CM | POA: Diagnosis not present

## 2022-10-27 DIAGNOSIS — K219 Gastro-esophageal reflux disease without esophagitis: Secondary | ICD-10-CM | POA: Diagnosis not present

## 2022-10-27 DIAGNOSIS — E785 Hyperlipidemia, unspecified: Secondary | ICD-10-CM | POA: Diagnosis not present

## 2022-10-27 DIAGNOSIS — Z125 Encounter for screening for malignant neoplasm of prostate: Secondary | ICD-10-CM | POA: Diagnosis not present

## 2022-10-27 DIAGNOSIS — E1169 Type 2 diabetes mellitus with other specified complication: Secondary | ICD-10-CM | POA: Diagnosis not present

## 2022-10-27 DIAGNOSIS — N1832 Chronic kidney disease, stage 3b: Secondary | ICD-10-CM | POA: Diagnosis not present

## 2022-11-03 DIAGNOSIS — E1169 Type 2 diabetes mellitus with other specified complication: Secondary | ICD-10-CM | POA: Diagnosis not present

## 2022-11-03 DIAGNOSIS — Z794 Long term (current) use of insulin: Secondary | ICD-10-CM | POA: Diagnosis not present

## 2022-11-03 DIAGNOSIS — D649 Anemia, unspecified: Secondary | ICD-10-CM | POA: Diagnosis not present

## 2022-11-03 DIAGNOSIS — I129 Hypertensive chronic kidney disease with stage 1 through stage 4 chronic kidney disease, or unspecified chronic kidney disease: Secondary | ICD-10-CM | POA: Diagnosis not present

## 2022-11-03 DIAGNOSIS — E785 Hyperlipidemia, unspecified: Secondary | ICD-10-CM | POA: Diagnosis not present

## 2022-11-03 DIAGNOSIS — Z8546 Personal history of malignant neoplasm of prostate: Secondary | ICD-10-CM | POA: Diagnosis not present

## 2022-11-03 DIAGNOSIS — Z0000 Encounter for general adult medical examination without abnormal findings: Secondary | ICD-10-CM | POA: Diagnosis not present

## 2022-11-03 DIAGNOSIS — Z1331 Encounter for screening for depression: Secondary | ICD-10-CM | POA: Diagnosis not present

## 2022-11-03 DIAGNOSIS — Z1339 Encounter for screening examination for other mental health and behavioral disorders: Secondary | ICD-10-CM | POA: Diagnosis not present

## 2022-11-03 DIAGNOSIS — N1832 Chronic kidney disease, stage 3b: Secondary | ICD-10-CM | POA: Diagnosis not present

## 2022-11-06 DIAGNOSIS — Z1212 Encounter for screening for malignant neoplasm of rectum: Secondary | ICD-10-CM | POA: Diagnosis not present

## 2022-11-06 DIAGNOSIS — D649 Anemia, unspecified: Secondary | ICD-10-CM | POA: Diagnosis not present

## 2022-11-16 NOTE — Progress Notes (Signed)
History of Present Illness:    Initial diagnosis in July 2015.  Prostate volume 32.6 mL, PSA 7.68, PSA density 0.256.  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   10.1.2015--he underwent I-125 brachytherapy.   5.3.2022--PSA < 0.1  5.7.2024:   Past Medical History:  Diagnosis Date   Allergy    seasonal allergies 2016   History of MRSA infection    1990's--  left abd.   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   Type 2 diabetes mellitus ([facility])    Wears glasses     Past Surgical History:  Procedure Laterality Date   ABDOMINAL EXPLORATION SURGERY  1980's   resection abscess and irrigation   APPENDECTOMY  1970's   COLONOSCOPY  2004   Rourk -normal per pt    PROSTATE BIOPSY     RADIOACTIVE SEED IMPLANT N/A 04/13/2014   Procedure: RADIOACTIVE SEED IMPLANT;  Surgeon: Chelsea Aus, MD;  Location: [facility];  Service: Urology;  Laterality: N/A;    Home Medications:  Allergies as of 11/18/2022       Reactions   Atorvastatin Other (See Comments)   Other reaction(s): Muscle pain   Metformin Other (See Comments)   Simvastatin Other (See Comments)   Other reaction(s): Muscle pain   Statins Other (See Comments)   Other reaction(s): Muscle pain   Empagliflozin    Other reaction(s): Serum creatinine raised        Medication List        Accurate as of Nov 16, 2022  4:21 PM. If you have any questions, ask your nurse or doctor.          amLODipine 10 MG tablet Commonly known as: NORVASC Take 10 mg by mouth every morning.   aspirin 81 MG tablet Take 81 mg by mouth daily.   insulin glargine 100 UNIT/ML Solostar Pen Commonly known as: LANTUS Inject 35 Units into the skin daily.   liraglutide 18 MG/3ML Sopn Commonly known as: VICTOZA Inject 185 mg into the skin daily.   lisinopril 20 MG tablet Commonly known as: ZESTRIL   loratadine 10 MG tablet Commonly known as: CLARITIN Take 10 mg  by mouth daily.   omeprazole 20 MG capsule Commonly known as: PRILOSEC Take 1 capsule (20 mg total) by mouth daily.   Rosuvastatin Calcium 10 MG Cpsp Take 5 mg by mouth daily.   sildenafil 50 MG tablet Commonly known as: VIAGRA Take 25 mg by mouth as needed.   traZODone 50 MG tablet Commonly known as: DESYREL Take 1 tablet by mouth daily.   Vitamin D3 25 MCG (1000 UT) Caps Take 1 capsule by mouth daily.        Allergies:  Allergies  Allergen Reactions   Atorvastatin Other (See Comments)    Other reaction(s): Muscle pain   Metformin Other (See Comments)   Simvastatin Other (See Comments)    Other reaction(s): Muscle pain   Statins Other (See Comments)    Other reaction(s): Muscle pain   Empagliflozin     Other reaction(s): Serum creatinine raised    Family History  Problem Relation Age of Onset   Diabetes Mother    Cancer Mother        lung   Alzheimer's disease Mother    Heart disease Father    Hypertension Sister    Diabetes Brother    Hypertension Sister    Diabetes Brother  Colon cancer Neg Hx    Esophageal cancer Neg Hx    Rectal cancer Neg Hx    Stomach cancer Neg Hx     Social History:  reports that he has never smoked. He has never used smokeless tobacco. He reports that he does not drink alcohol and does not use drugs.  ROS: A complete review of systems was performed.  All systems are negative except for pertinent findings as noted.  Physical Exam:  Vital signs in last 24 hours: There were no vitals taken for this visit. Constitutional:  Alert and oriented, No acute distress Cardiovascular: Regular rate  Respiratory: Normal respiratory effort GI: Abdomen is soft, nontender, nondistended, no abdominal masses. No CVAT.  Genitourinary: Normal male phallus, testes are descended bilaterally and non-tender and without masses, scrotum is normal in appearance without lesions or masses, perineum is normal on inspection. Lymphatic: No  lymphadenopathy Neurologic: Grossly intact, no focal deficits Psychiatric: Normal mood and affect  I have reviewed prior pt notes  I have reviewed notes from referring/previous physicians  I have reviewed urinalysis results  I have independently reviewed prior imaging  I have reviewed prior PSA results  I have reviewed prior urine culture   Impression/Assessment:  ***  Plan:  ***

## 2022-11-18 ENCOUNTER — Ambulatory Visit: Payer: Medicare PPO | Admitting: Urology

## 2022-11-18 ENCOUNTER — Encounter: Payer: Self-pay | Admitting: Urology

## 2022-11-18 VITALS — BP 119/76 | HR 105 | Ht 71.0 in | Wt 173.0 lb

## 2022-11-18 DIAGNOSIS — Z8546 Personal history of malignant neoplasm of prostate: Secondary | ICD-10-CM | POA: Diagnosis not present

## 2022-11-18 DIAGNOSIS — N5201 Erectile dysfunction due to arterial insufficiency: Secondary | ICD-10-CM

## 2023-05-07 DIAGNOSIS — H40013 Open angle with borderline findings, low risk, bilateral: Secondary | ICD-10-CM | POA: Diagnosis not present

## 2023-05-07 DIAGNOSIS — E119 Type 2 diabetes mellitus without complications: Secondary | ICD-10-CM | POA: Diagnosis not present

## 2023-05-07 DIAGNOSIS — H25811 Combined forms of age-related cataract, right eye: Secondary | ICD-10-CM | POA: Diagnosis not present

## 2023-05-15 DIAGNOSIS — H25811 Combined forms of age-related cataract, right eye: Secondary | ICD-10-CM | POA: Diagnosis not present

## 2023-05-28 DIAGNOSIS — H2512 Age-related nuclear cataract, left eye: Secondary | ICD-10-CM | POA: Diagnosis not present

## 2023-06-02 DIAGNOSIS — H25812 Combined forms of age-related cataract, left eye: Secondary | ICD-10-CM | POA: Diagnosis not present

## 2023-08-13 DIAGNOSIS — C44291 Other specified malignant neoplasm of skin of unspecified ear and external auricular canal: Secondary | ICD-10-CM | POA: Diagnosis not present

## 2023-08-13 DIAGNOSIS — D485 Neoplasm of uncertain behavior of skin: Secondary | ICD-10-CM | POA: Diagnosis not present

## 2023-08-13 DIAGNOSIS — D229 Melanocytic nevi, unspecified: Secondary | ICD-10-CM | POA: Diagnosis not present

## 2023-08-13 DIAGNOSIS — L853 Xerosis cutis: Secondary | ICD-10-CM | POA: Diagnosis not present

## 2023-09-08 DIAGNOSIS — C44292 Other specified malignant neoplasm of skin of right ear and external auricular canal: Secondary | ICD-10-CM | POA: Diagnosis not present

## 2023-09-09 DIAGNOSIS — C44292 Other specified malignant neoplasm of skin of right ear and external auricular canal: Secondary | ICD-10-CM | POA: Diagnosis not present

## 2023-09-10 DIAGNOSIS — C44292 Other specified malignant neoplasm of skin of right ear and external auricular canal: Secondary | ICD-10-CM | POA: Diagnosis not present

## 2023-09-14 DIAGNOSIS — C44292 Other specified malignant neoplasm of skin of right ear and external auricular canal: Secondary | ICD-10-CM | POA: Diagnosis not present

## 2023-09-16 DIAGNOSIS — C44292 Other specified malignant neoplasm of skin of right ear and external auricular canal: Secondary | ICD-10-CM | POA: Diagnosis not present

## 2023-09-17 DIAGNOSIS — C44292 Other specified malignant neoplasm of skin of right ear and external auricular canal: Secondary | ICD-10-CM | POA: Diagnosis not present

## 2023-09-21 DIAGNOSIS — C44292 Other specified malignant neoplasm of skin of right ear and external auricular canal: Secondary | ICD-10-CM | POA: Diagnosis not present

## 2023-09-22 DIAGNOSIS — C44292 Other specified malignant neoplasm of skin of right ear and external auricular canal: Secondary | ICD-10-CM | POA: Diagnosis not present

## 2023-09-23 DIAGNOSIS — C44292 Other specified malignant neoplasm of skin of right ear and external auricular canal: Secondary | ICD-10-CM | POA: Diagnosis not present

## 2023-09-24 DIAGNOSIS — C44292 Other specified malignant neoplasm of skin of right ear and external auricular canal: Secondary | ICD-10-CM | POA: Diagnosis not present

## 2023-09-28 DIAGNOSIS — C44292 Other specified malignant neoplasm of skin of right ear and external auricular canal: Secondary | ICD-10-CM | POA: Diagnosis not present

## 2023-09-29 DIAGNOSIS — C44292 Other specified malignant neoplasm of skin of right ear and external auricular canal: Secondary | ICD-10-CM | POA: Diagnosis not present

## 2023-09-30 DIAGNOSIS — C44292 Other specified malignant neoplasm of skin of right ear and external auricular canal: Secondary | ICD-10-CM | POA: Diagnosis not present

## 2023-10-01 DIAGNOSIS — C44292 Other specified malignant neoplasm of skin of right ear and external auricular canal: Secondary | ICD-10-CM | POA: Diagnosis not present

## 2023-10-06 DIAGNOSIS — C44292 Other specified malignant neoplasm of skin of right ear and external auricular canal: Secondary | ICD-10-CM | POA: Diagnosis not present

## 2023-10-07 DIAGNOSIS — C44292 Other specified malignant neoplasm of skin of right ear and external auricular canal: Secondary | ICD-10-CM | POA: Diagnosis not present

## 2023-10-08 DIAGNOSIS — C44292 Other specified malignant neoplasm of skin of right ear and external auricular canal: Secondary | ICD-10-CM | POA: Diagnosis not present

## 2023-10-12 DIAGNOSIS — C44292 Other specified malignant neoplasm of skin of right ear and external auricular canal: Secondary | ICD-10-CM | POA: Diagnosis not present

## 2023-10-13 DIAGNOSIS — C44292 Other specified malignant neoplasm of skin of right ear and external auricular canal: Secondary | ICD-10-CM | POA: Diagnosis not present

## 2023-10-14 DIAGNOSIS — C44292 Other specified malignant neoplasm of skin of right ear and external auricular canal: Secondary | ICD-10-CM | POA: Diagnosis not present

## 2023-10-15 DIAGNOSIS — C44292 Other specified malignant neoplasm of skin of right ear and external auricular canal: Secondary | ICD-10-CM | POA: Diagnosis not present

## 2023-10-24 DIAGNOSIS — C44292 Other specified malignant neoplasm of skin of right ear and external auricular canal: Secondary | ICD-10-CM | POA: Diagnosis not present

## 2023-11-03 DIAGNOSIS — E1169 Type 2 diabetes mellitus with other specified complication: Secondary | ICD-10-CM | POA: Diagnosis not present

## 2023-11-03 DIAGNOSIS — Z125 Encounter for screening for malignant neoplasm of prostate: Secondary | ICD-10-CM | POA: Diagnosis not present

## 2023-11-03 DIAGNOSIS — N1832 Chronic kidney disease, stage 3b: Secondary | ICD-10-CM | POA: Diagnosis not present

## 2023-11-03 DIAGNOSIS — Z8546 Personal history of malignant neoplasm of prostate: Secondary | ICD-10-CM | POA: Diagnosis not present

## 2023-11-03 DIAGNOSIS — I129 Hypertensive chronic kidney disease with stage 1 through stage 4 chronic kidney disease, or unspecified chronic kidney disease: Secondary | ICD-10-CM | POA: Diagnosis not present

## 2023-11-03 DIAGNOSIS — E785 Hyperlipidemia, unspecified: Secondary | ICD-10-CM | POA: Diagnosis not present

## 2023-11-03 DIAGNOSIS — D649 Anemia, unspecified: Secondary | ICD-10-CM | POA: Diagnosis not present

## 2023-11-10 DIAGNOSIS — Z1339 Encounter for screening examination for other mental health and behavioral disorders: Secondary | ICD-10-CM | POA: Diagnosis not present

## 2023-11-10 DIAGNOSIS — Z0000 Encounter for general adult medical examination without abnormal findings: Secondary | ICD-10-CM | POA: Diagnosis not present

## 2023-11-10 DIAGNOSIS — M1990 Unspecified osteoarthritis, unspecified site: Secondary | ICD-10-CM | POA: Diagnosis not present

## 2023-11-10 DIAGNOSIS — M65332 Trigger finger, left middle finger: Secondary | ICD-10-CM | POA: Diagnosis not present

## 2023-11-10 DIAGNOSIS — E785 Hyperlipidemia, unspecified: Secondary | ICD-10-CM | POA: Diagnosis not present

## 2023-11-10 DIAGNOSIS — C439 Malignant melanoma of skin, unspecified: Secondary | ICD-10-CM | POA: Diagnosis not present

## 2023-11-10 DIAGNOSIS — K219 Gastro-esophageal reflux disease without esophagitis: Secondary | ICD-10-CM | POA: Diagnosis not present

## 2023-11-10 DIAGNOSIS — Z1331 Encounter for screening for depression: Secondary | ICD-10-CM | POA: Diagnosis not present

## 2023-11-10 DIAGNOSIS — E1169 Type 2 diabetes mellitus with other specified complication: Secondary | ICD-10-CM | POA: Diagnosis not present

## 2023-11-10 DIAGNOSIS — I129 Hypertensive chronic kidney disease with stage 1 through stage 4 chronic kidney disease, or unspecified chronic kidney disease: Secondary | ICD-10-CM | POA: Diagnosis not present

## 2023-11-10 DIAGNOSIS — N1832 Chronic kidney disease, stage 3b: Secondary | ICD-10-CM | POA: Diagnosis not present

## 2023-11-10 DIAGNOSIS — R82998 Other abnormal findings in urine: Secondary | ICD-10-CM | POA: Diagnosis not present

## 2023-11-16 NOTE — Progress Notes (Signed)
 History of Present Illness:    Initial diagnosis in July 2015.  Prostate volume 32.6 mL, PSA 7.68, PSA density 0.256.  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   10.1.2015--he underwent I-125 brachytherapy.   5.3.2022--PSA < 0.1  5.7.2024:  No recent PSA. No blood in urine or stool.  IPSS 14  QoL 2 He is on no therapy for BPH or OAB.  He limits caffeine intake.  He saw Dr. Jolee Naval yesterday.  He does not know if he had a PSA checked.  Past Medical History:  Diagnosis Date   Allergy    seasonal allergies 2016   History of MRSA infection    1990's--  left abd.   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   Type 2 diabetes mellitus ([facility])    Wears glasses     Past Surgical History:  Procedure Laterality Date   ABDOMINAL EXPLORATION SURGERY  1980's   resection abscess and irrigation   APPENDECTOMY  1970's   COLONOSCOPY  2004   Rourk -normal per pt    PROSTATE BIOPSY     RADIOACTIVE SEED IMPLANT N/A 04/13/2014   Procedure: RADIOACTIVE SEED IMPLANT;  Surgeon: Roque Collar, MD;  Location: [facility];  Service: Urology;  Laterality: N/A;    Home Medications:  Allergies as of 11/17/2023       Reactions   Atorvastatin Other (See Comments)   Other reaction(s): Muscle pain   Metformin Other (See Comments)   Simvastatin Other (See Comments)   Other reaction(s): Muscle pain   Statins Other (See Comments)   Other reaction(s): Muscle pain   Empagliflozin    Other reaction(s): Serum creatinine raised        Medication List        Accurate as of Nov 16, 2023  7:10 PM. If you have any questions, ask your nurse or doctor.          amLODipine 10 MG tablet Commonly known as: NORVASC Take 10 mg by mouth every morning.   aspirin 81 MG tablet Take 81 mg by mouth daily.   insulin  glargine 100 UNIT/ML Solostar Pen Commonly known as: LANTUS Inject 35 Units into the skin daily.   liraglutide 18  MG/3ML Sopn Commonly known as: VICTOZA Inject 185 mg into the skin daily.   lisinopril 20 MG tablet Commonly known as: ZESTRIL   loratadine 10 MG tablet Commonly known as: CLARITIN Take 10 mg by mouth daily.   omeprazole  20 MG capsule Commonly known as: PRILOSEC Take 1 capsule (20 mg total) by mouth daily.   Rosuvastatin Calcium 10 MG Cpsp Take 5 mg by mouth daily.   sildenafil 50 MG tablet Commonly known as: VIAGRA Take 25 mg by mouth as needed.   traZODone 50 MG tablet Commonly known as: DESYREL Take 1 tablet by mouth daily.   Vitamin D3 25 MCG (1000 UT) Caps Take 1 capsule by mouth daily.        Allergies:  Allergies  Allergen Reactions   Atorvastatin Other (See Comments)    Other reaction(s): Muscle pain   Metformin Other (See Comments)   Simvastatin Other (See Comments)    Other reaction(s): Muscle pain   Statins Other (See Comments)    Other reaction(s): Muscle pain   Empagliflozin     Other reaction(s): Serum creatinine raised    Family History  Problem Relation Age of Onset   Diabetes  Mother    Cancer Mother        lung   Alzheimer's disease Mother    Heart disease Father    Hypertension Sister    Diabetes Brother    Hypertension Sister    Diabetes Brother    Colon cancer Neg Hx    Esophageal cancer Neg Hx    Rectal cancer Neg Hx    Stomach cancer Neg Hx     Social History:  reports that he has never smoked. He has never used smokeless tobacco. He reports that he does not drink alcohol  and does not use drugs.  ROS: A complete review of systems was performed.  All systems are negative except for pertinent findings as noted.  Physical Exam:  Vital signs in last 24 hours: There were no vitals taken for this visit. Constitutional:  Alert and oriented, No acute distress Cardiovascular: Regular rate  Respiratory: Normal respiratory effort GU: Normal anal sphincter tone.  Prostate smooth, flat, no nodularity. Neurologic: Grossly intact, no  focal deficits Psychiatric: Normal mood and affect  I have reviewed prior pt notes  I have independently reviewed prior imaging-transrectal ultrasound results  I have reviewed prior PSA and pathology results    Impression/Assessment:  Grade group 1 prostate cancer, status post brachytherapy October, 2015 with excellent PSA response thus far.  I have not seen a recent PSA, however  Overactive bladder symptoms, not terribly bothersome to the patient  Plan:

## 2023-11-17 ENCOUNTER — Encounter: Payer: Self-pay | Admitting: Urology

## 2023-11-17 ENCOUNTER — Ambulatory Visit: Payer: Medicare PPO | Admitting: Urology

## 2023-11-17 VITALS — BP 130/72 | HR 100

## 2023-11-17 DIAGNOSIS — Z8546 Personal history of malignant neoplasm of prostate: Secondary | ICD-10-CM | POA: Diagnosis not present

## 2023-11-17 DIAGNOSIS — N5201 Erectile dysfunction due to arterial insufficiency: Secondary | ICD-10-CM | POA: Diagnosis not present

## 2023-11-17 DIAGNOSIS — R3129 Other microscopic hematuria: Secondary | ICD-10-CM | POA: Diagnosis not present

## 2023-11-17 LAB — URINALYSIS, ROUTINE W REFLEX MICROSCOPIC
Bilirubin, UA: NEGATIVE
Ketones, UA: NEGATIVE
Leukocytes,UA: NEGATIVE
Nitrite, UA: NEGATIVE
Specific Gravity, UA: 1.025 (ref 1.005–1.030)
Urobilinogen, Ur: 1 mg/dL (ref 0.2–1.0)
pH, UA: 6 (ref 5.0–7.5)

## 2023-11-17 LAB — MICROSCOPIC EXAMINATION
Bacteria, UA: NONE SEEN
Epithelial Cells (non renal): NONE SEEN /HPF (ref 0–10)
WBC, UA: NONE SEEN /HPF (ref 0–5)

## 2023-12-29 ENCOUNTER — Other Ambulatory Visit

## 2023-12-29 DIAGNOSIS — R3129 Other microscopic hematuria: Secondary | ICD-10-CM | POA: Diagnosis not present

## 2023-12-29 LAB — URINALYSIS, ROUTINE W REFLEX MICROSCOPIC
Bilirubin, UA: NEGATIVE
Glucose, UA: NEGATIVE
Leukocytes,UA: NEGATIVE
Nitrite, UA: NEGATIVE
RBC, UA: NEGATIVE
Specific Gravity, UA: 1.03 (ref 1.005–1.030)
Urobilinogen, Ur: 1 mg/dL (ref 0.2–1.0)
pH, UA: 5.5 (ref 5.0–7.5)

## 2023-12-30 DIAGNOSIS — Z961 Presence of intraocular lens: Secondary | ICD-10-CM | POA: Diagnosis not present

## 2023-12-30 DIAGNOSIS — H47291 Other optic atrophy, right eye: Secondary | ICD-10-CM | POA: Diagnosis not present

## 2023-12-30 DIAGNOSIS — H40013 Open angle with borderline findings, low risk, bilateral: Secondary | ICD-10-CM | POA: Diagnosis not present

## 2023-12-30 DIAGNOSIS — E119 Type 2 diabetes mellitus without complications: Secondary | ICD-10-CM | POA: Diagnosis not present

## 2023-12-31 NOTE — Progress Notes (Signed)
 [facility] - Clinic Note  01/05/2024   CHIEF COMPLAINT Patient presents for Retina Evaluation  HISTORY OF PRESENT ILLNESS: Miguel Powers is a 78 y.o. male who presents to the clinic today for:  HPI     Retina Evaluation   In both eyes.  This started 2 months ago.  Duration of 2 months.  Context:  distance vision.  Treatments tried include no treatments.  Response to treatment was no improvement.  I, the attending physician,  performed the HPI with the patient and updated documentation appropriately.        Comments   NP here referred from Dr. Glendia Gaudy for RNFL thinning/nerve pallor. Pt states that back in April he underwent some treatments for skin cancer on his right ear. He states that's when he noticed OD VA seemed weaker. Pt does state he saw some tiny black dots that randomly appear but aren't constant. Pt is diabetic for 15 years, takes insulin . Pt has HTN. Pt had cataract surgery Oct/Nov 2024 w/ Dr. Gaudy. Uses rx specs just for reading.       Last edited by Valdemar Rogue, MD on 01/06/2024 12:04 AM.    Pt is here on the referral of Dr. Glendia Gaudy for concern of RNFL thinning, pt states Dr. 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 Gaudy told him he had some loss in the back of his right eye, pt denies hx of heart attack or stroke, pt states he is diabetic and on insulin  and something like Ozempic, he has never been hospitalized for diabetes, no heart or lung sx   Referring physician: Gaudy Charlie Glendia, MD [address] [place],  [place] [address]  HISTORICAL INFORMATION:  Selected notes from the MEDICAL RECORD NUMBER Referred by Dr. Glendia Gaudy for RNFL thinning OD LEE:  Ocular Hx- PMH-   CURRENT MEDICATIONS: No current outpatient medications on file. (Ophthalmic Drugs)   No current facility-administered  medications for this visit. (Ophthalmic Drugs)   Current Outpatient Medications (Other)  Medication Sig   amLODipine (NORVASC) 10 MG tablet Take 10 mg by mouth every morning.    aspirin 81 MG tablet Take 81 mg by mouth daily.   Cholecalciferol (VITAMIN D3) 1000 UNITS CAPS Take 1 capsule by mouth daily.   insulin  glargine (LANTUS) 100 UNIT/ML Solostar Pen Inject 35 Units into the skin daily.   liraglutide (VICTOZA) 18 MG/3ML SOPN Inject 185 mg into the skin daily.   lisinopril (ZESTRIL) 20 MG tablet    loratadine (CLARITIN) 10 MG tablet Take 10 mg by mouth daily.   omeprazole  (PRILOSEC) 20 MG capsule Take 1 capsule (20 mg total) by mouth daily.   Rosuvastatin Calcium 10 MG CPSP Take 5 mg by mouth daily.    sildenafil (VIAGRA) 50 MG tablet Take 25 mg by mouth as needed.   traZODone (DESYREL) 50 MG tablet Take 1 tablet by mouth daily.   No current facility-administered medications for this visit. (Other)   REVIEW OF SYSTEMS: ROS   Positive for: Endocrine, Cardiovascular, Eyes Negative for: Constitutional, Gastrointestinal, Neurological, Skin, Genitourinary, Musculoskeletal, HENT, Respiratory, Psychiatric, Allergic/Imm, Heme/Lymph Last edited by Antonetta Almetta BRAVO, COT on 01/05/2024  7:59 AM.     ALLERGIES Allergies  Allergen Reactions   Atorvastatin Other (See Comments)    Other reaction(s): Muscle pain  Metformin Other (See Comments)   Simvastatin Other (See Comments)    Other reaction(s): Muscle pain   Statins Other (See Comments)    Other reaction(s): Muscle pain   Empagliflozin     Other reaction(s): Serum creatinine raised   PAST MEDICAL HISTORY Past Medical History:  Diagnosis Date   Allergy    seasonal allergies 2016   History of MRSA infection    1990's--  left abd.   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   Type 2 diabetes mellitus (HCC)    Wears glasses    Past Surgical History:  Procedure  Laterality Date   ABDOMINAL EXPLORATION SURGERY  1980's   resection abscess and irrigation   APPENDECTOMY  1970's   COLONOSCOPY  2004   Rourk -normal per pt    PROSTATE BIOPSY     RADIOACTIVE SEED IMPLANT N/A 04/13/2014   Procedure: RADIOACTIVE SEED IMPLANT;  Surgeon: Garnette CHRISTELLA Shack, MD;  Location: [facility];  Service: Urology;  Laterality: N/A;   FAMILY HISTORY Family History  Problem Relation Age of Onset   Diabetes Mother    Cancer Mother        lung   Alzheimer's disease Mother    Heart disease Father    Hypertension Sister    Diabetes Brother    Hypertension Sister    Diabetes Brother    Colon cancer Neg Hx    Esophageal cancer Neg Hx    Rectal cancer Neg Hx    Stomach cancer Neg Hx    SOCIAL HISTORY Social History   Tobacco Use   Smoking status: Never   Smokeless tobacco: Never  Vaping Use   Vaping status: Never Used  Substance Use Topics   Alcohol  use: No   Drug use: No       OPHTHALMIC EXAM:  Base Eye Exam     Visual Acuity (Snellen - Linear)       Right Left   Dist [place] 20/30 +1 20/20   Dist ph [place] 20/25          Tonometry (Tonopen, 8:08 AM)       Right Left   Pressure 13 13         Pupils       Pupils Dark Light Shape React APD   Right PERRL 2 1 Round Minimal None   Left PERRL 2 1 Round Minimal None         Visual Fields (Counting fingers)       Left Right    Full Full         Extraocular Movement       Right Left    Full, Ortho Full, Ortho         Neuro/Psych     Oriented x3: Yes   Mood/Affect: Normal         Dilation     Both eyes: 1.0% Mydriacyl, 2.5% Phenylephrine @ 8:08 AM           Slit Lamp and Fundus Exam     Slit Lamp Exam       Right Left   Lids/Lashes Dermatochalasis - upper lid Dermatochalasis - upper lid   Conjunctiva/Sclera mild melanosis, nasal pingeucula mild melanosis, nasal and temporal pinguecula   Cornea arcus, trace tear film debris, well healed cataract  wound arcus, sub epi scar at 0730, well healed cataract wound   Anterior  Chamber deep and clear deep and clear   Iris Round and dilated, No NVI Round and dilated, No NVI   Lens PC IOL in good position PC IOL in good position   Anterior Vitreous mild syneresis mild syneresis         Fundus Exam       Right Left   Disc 2-3+pallor, Sharp rim Pink and Sharp, mild PPA   C/D Ratio 0.2 0.2   Macula Flat, Good foveal reflex, rare MA, mild diffuse atrophy, RPE mottling, no edema Flat, Good foveal reflex, mild RPE mottling, trace ERM, No heme or edema   Vessels attenuated, mild tortuosity attenuated, mild tortuosity, focal blot heme along ST arcades   Periphery Attached, No heme Attached, scattered MA / DBH           Refraction     Manifest Refraction       Sphere Cylinder Axis Dist VA   Right -0.25 +0.50 030 20/25   Left               IMAGING AND PROCEDURES  Imaging and Procedures for 01/05/2024  OCT, Retina - OU - Both Eyes       Right Eye Quality was good. Central Foveal Thickness: 240. Progression has no prior data. Findings include normal foveal contour, no IRF, no SRF, vitreomacular adhesion (Mild diffuse retinal atrophy).   Left Eye Quality was good. Central Foveal Thickness: 264. Progression has no prior data. Findings include normal foveal contour, no IRF, no SRF (Partial PVD).   Notes *Images captured and stored on drive  Diagnosis / Impression:  NFP, no IRF/SRF OU OD: Mild diffuse retinal atrophy  Clinical management:  See below  Abbreviations: NFP - Normal foveal profile. CME - cystoid macular edema. PED - pigment epithelial detachment. IRF - intraretinal fluid. SRF - subretinal fluid. EZ - ellipsoid zone. ERM - epiretinal membrane. ORA - outer retinal atrophy. ORT - outer retinal tubulation. SRHM - subretinal hyper-reflective material. IRHM - intraretinal hyper-reflective material      Fluorescein Angiography Optos (Transit OD)       Right  Eye Progression has no prior data. Early phase findings include microaneurysm. Mid/Late phase findings include microaneurysm.   Left Eye Progression has no prior data. Early phase findings include delayed filling, leakage, staining, microaneurysm, retinal neovascularization, vascular perfusion defect. Mid/Late phase findings include leakage, staining, microaneurysm, retinal neovascularization, vascular perfusion defect.   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 Moderate nonproliferative diabetic retinopathy of right eye without macular edema associated with type 2 diabetes mellitus (HCC)  Z88.6608     5. Current use of insulin  (HCC)  Z79.4     6. Essential hypertension  I10     7. Hypertensive retinopathy of both eyes  H35.033 Fluorescein Angiography Optos (Transit OD)    8. Pseudophakia, both eyes  Z96.1      1,2. Retinal and optic atrophy OD  - referred by Dr. Octavia who  reports interval peripapillary RNFL thinning on OCT from Nov 2024 to June 2025 and development of disc pallor OD  - OCT shows diffuse retinal atrophy, exam shows 2-3+ disc pallor OD  - MRI ordered by Dr. Octavia -- scheduled for July 3  - discussed findings and possible etiologies -- suspect side effect of radiation exposure for skin/ear melanoma tx in April 2025 +/- ischemia  - FA with possibly delayed filling time OD  - recommend checking carotid dopplers -- will discuss with Dr. Octavia - no retinal or ophthalmic interventions indicated or recommended for  atrophy  - f/u 3-4 months, DFE, OCT  3-5. Proliferative diabetic retinopathy OS        Moderate nonproliferative diabetic retinopathy OD - The incidence, risk factors for progression, natural history and treatment options for diabetic retinopathy were discussed with patient.   - The need for close monitoring of blood glucose, blood pressure, and serum lipids, avoiding cigarette or any type of tobacco, and the need for long term follow up was also discussed with patient. - exam shows mild MA/DBH OU (OS > OD) - FA today (06.24.25) shows late-leaking MA OU, vascular nonperfusion, +NV OS; no NV OD -- pt would benefit from PRP OS - OCT without diabetic macular edema, both eyes  - discussed findings and prognosis - f/u in 2-4 wks for PRP OS  6,7. Hypertensive retinopathy OU - discussed importance of tight BP control - monitor  8. Pseudophakia OU  - s/p CE/IOL (Dr. Octavia, Oct / Nov. 2024)  - IOL in good position, doing well  - monitor   Ophthalmic Meds Ordered this visit:  No orders of the defined types were placed in this encounter.    Return for f/u 2-4 wks - review FA and possible laser PRP OS.  There are no Patient Instructions on file for this visit.  Explained the diagnoses, plan, and follow up with the patient and they expressed understanding.  Patient expressed understanding of the importance of proper follow up care.   This document serves as a record of services personally performed by Redell JUDITHANN Hans, MD, PhD. It was created on their behalf by Alan PARAS. Delores, OA an ophthalmic technician. The creation of this record is the provider's dictation and/or activities during the visit.    Electronically signed by: Alan PARAS. Delores, OA 01/06/24 1:12 AM  Redell JUDITHANN Hans, M.D., Ph.D. Diseases & Surgery of the Retina and Vitreous [facility] 01/05/2024  I have reviewed the above documentation for accuracy and completeness, and I agree with the above. Redell JUDITHANN Hans, M.D., Ph.D. 01/06/24 1:12 AM   Abbreviations: M myopia (nearsighted); A astigmatism; H hyperopia (farsighted); P presbyopia; Mrx spectacle prescription;  CTL contact lenses; OD right eye; OS left eye; OU both eyes  XT exotropia; ET esotropia; PEK punctate epithelial keratitis; PEE punctate epithelial erosions; DES dry eye syndrome; MGD meibomian gland dysfunction; ATs artificial tears; PFAT's preservative free artificial tears; NSC nuclear sclerotic cataract; PSC posterior subcapsular cataract; ERM epi-retinal membrane; PVD posterior vitreous detachment; RD retinal detachment; DM diabetes mellitus; DR diabetic retinopathy; NPDR non-proliferative diabetic retinopathy; PDR proliferative diabetic retinopathy; CSME clinically significant macular edema; DME diabetic macular edema; dbh dot blot hemorrhages; CWS cotton wool spot; POAG primary open angle glaucoma; C/D cup-to-disc ratio; HVF humphrey visual field; GVF goldmann visual field; OCT optical coherence tomography; IOP intraocular pressure; BRVO Branch retinal vein occlusion; CRVO central retinal vein occlusion; CRAO central retinal artery occlusion; BRAO branch retinal artery  occlusion; RT retinal tear; SB scleral buckle; PPV pars plana vitrectomy; VH Vitreous hemorrhage; PRP panretinal laser photocoagulation; IVK intravitreal kenalog; VMT vitreomacular traction; MH Macular hole;  NVD neovascularization of the disc; NVE neovascularization elsewhere; AREDS age related eye disease study; ARMD age related macular degeneration; POAG primary open angle glaucoma; EBMD epithelial/anterior basement membrane dystrophy; ACIOL anterior chamber intraocular lens; IOL intraocular lens; PCIOL posterior chamber intraocular lens; Phaco/IOL phacoemulsification with intraocular lens placement; PRK photorefractive keratectomy; LASIK laser assisted in situ keratomileusis; HTN hypertension; DM diabetes mellitus; COPD chronic obstructive pulmonary disease

## 2024-01-05 ENCOUNTER — Ambulatory Visit (INDEPENDENT_AMBULATORY_CARE_PROVIDER_SITE_OTHER): Payer: Self-pay | Admitting: Ophthalmology

## 2024-01-05 ENCOUNTER — Encounter (INDEPENDENT_AMBULATORY_CARE_PROVIDER_SITE_OTHER): Payer: Self-pay | Admitting: Ophthalmology

## 2024-01-05 VITALS — BP 117/76 | HR 94

## 2024-01-05 DIAGNOSIS — E113393 Type 2 diabetes mellitus with moderate nonproliferative diabetic retinopathy without macular edema, bilateral: Secondary | ICD-10-CM

## 2024-01-05 DIAGNOSIS — H4720 Unspecified optic atrophy: Secondary | ICD-10-CM

## 2024-01-05 DIAGNOSIS — H35033 Hypertensive retinopathy, bilateral: Secondary | ICD-10-CM | POA: Diagnosis not present

## 2024-01-05 DIAGNOSIS — E113391 Type 2 diabetes mellitus with moderate nonproliferative diabetic retinopathy without macular edema, right eye: Secondary | ICD-10-CM | POA: Diagnosis not present

## 2024-01-05 DIAGNOSIS — Z794 Long term (current) use of insulin: Secondary | ICD-10-CM | POA: Diagnosis not present

## 2024-01-05 DIAGNOSIS — H3589 Other specified retinal disorders: Secondary | ICD-10-CM | POA: Diagnosis not present

## 2024-01-05 DIAGNOSIS — E113592 Type 2 diabetes mellitus with proliferative diabetic retinopathy without macular edema, left eye: Secondary | ICD-10-CM

## 2024-01-05 DIAGNOSIS — I10 Essential (primary) hypertension: Secondary | ICD-10-CM | POA: Diagnosis not present

## 2024-01-05 DIAGNOSIS — Z961 Presence of intraocular lens: Secondary | ICD-10-CM | POA: Diagnosis not present

## 2024-01-05 DIAGNOSIS — H3581 Retinal edema: Secondary | ICD-10-CM

## 2024-01-06 ENCOUNTER — Encounter (INDEPENDENT_AMBULATORY_CARE_PROVIDER_SITE_OTHER): Payer: Self-pay | Admitting: Ophthalmology

## 2024-01-14 DIAGNOSIS — I6782 Cerebral ischemia: Secondary | ICD-10-CM | POA: Diagnosis not present

## 2024-01-14 DIAGNOSIS — Z8673 Personal history of transient ischemic attack (TIA), and cerebral infarction without residual deficits: Secondary | ICD-10-CM | POA: Diagnosis not present

## 2024-01-14 DIAGNOSIS — H47293 Other optic atrophy, bilateral: Secondary | ICD-10-CM | POA: Diagnosis not present

## 2024-02-08 DIAGNOSIS — H40013 Open angle with borderline findings, low risk, bilateral: Secondary | ICD-10-CM | POA: Diagnosis not present

## 2024-02-08 DIAGNOSIS — E119 Type 2 diabetes mellitus without complications: Secondary | ICD-10-CM | POA: Diagnosis not present

## 2024-02-08 DIAGNOSIS — H47291 Other optic atrophy, right eye: Secondary | ICD-10-CM | POA: Diagnosis not present

## 2024-02-08 DIAGNOSIS — Z961 Presence of intraocular lens: Secondary | ICD-10-CM | POA: Diagnosis not present

## 2024-05-04 NOTE — Progress Notes (Shared)
 [facility] - Clinic Note  05/06/2024   CHIEF COMPLAINT Patient presents for No chief complaint on file.  HISTORY OF PRESENT ILLNESS: Miguel Powers is a 78 y.o. male who presents to the clinic today for:   Pt is here on the referral of Dr. Glendia Gaudy for concern of RNFL thinning, pt states Dr. 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 Gaudy told him he had some loss in the back of his right eye, pt denies hx of heart attack or stroke, pt states he is diabetic and on insulin  and something like Ozempic, he has never been hospitalized for diabetes, no heart or lung sx   Referring physician: Larnell Glendia, MD [address] [place],  [place] [address]  HISTORICAL INFORMATION:  Selected notes from the MEDICAL RECORD NUMBER Referred by Dr. Glendia Gaudy for RNFL thinning OD LEE:  Ocular Hx- PMH-   CURRENT MEDICATIONS: No current outpatient medications on file. (Ophthalmic Drugs)   No current facility-administered medications for this visit. (Ophthalmic Drugs)   Current Outpatient Medications (Other)  Medication Sig   amLODipine (NORVASC) 10 MG tablet Take 10 mg by mouth every morning.    aspirin 81 MG tablet Take 81 mg by mouth daily.   Cholecalciferol (VITAMIN D3) 1000 UNITS CAPS Take 1 capsule by mouth daily.   insulin  glargine (LANTUS) 100 UNIT/ML Solostar Pen Inject 35 Units into the skin daily.   liraglutide (VICTOZA) 18 MG/3ML SOPN Inject 185 mg into the skin daily.   lisinopril (ZESTRIL) 20 MG tablet    loratadine (CLARITIN) 10 MG tablet Take 10 mg by mouth daily.   omeprazole  (PRILOSEC) 20 MG capsule Take 1 capsule (20 mg total) by mouth daily.   Rosuvastatin Calcium 10 MG CPSP Take 5 mg by mouth daily.    sildenafil (VIAGRA) 50 MG tablet Take 25 mg by mouth as needed.   traZODone (DESYREL) 50 MG tablet Take 1 tablet by  mouth daily.   No current facility-administered medications for this visit. (Other)   REVIEW OF SYSTEMS:   ALLERGIES Allergies  Allergen Reactions   Atorvastatin Other (See Comments)    Other reaction(s): Muscle pain   Metformin Other (See Comments)   Simvastatin Other (See Comments)    Other reaction(s): Muscle pain   Statins Other (See Comments)    Other reaction(s): Muscle pain   Empagliflozin     Other reaction(s): Serum creatinine raised   PAST MEDICAL HISTORY Past Medical History:  Diagnosis Date   Allergy    seasonal allergies 2016   History of MRSA infection    1990's--  left abd.   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   Type 2 diabetes mellitus (HCC)    Wears glasses    Past Surgical History:  Procedure Laterality Date   ABDOMINAL EXPLORATION SURGERY  1980's   resection abscess and irrigation   APPENDECTOMY  1970's   COLONOSCOPY  2004   Rourk -normal per pt    PROSTATE BIOPSY     RADIOACTIVE SEED IMPLANT N/A 04/13/2014   Procedure: RADIOACTIVE SEED IMPLANT;  Surgeon: Garnette CHRISTELLA Shack, MD;  Location: [facility];  Service: Urology;  Laterality: N/A;   FAMILY HISTORY Family History  Problem Relation Age  of Onset   Diabetes Mother    Cancer Mother        lung   Alzheimer's disease Mother    Heart disease Father    Hypertension Sister    Diabetes Brother    Hypertension Sister    Diabetes Brother    Colon cancer Neg Hx    Esophageal cancer Neg Hx    Rectal cancer Neg Hx    Stomach cancer Neg Hx    SOCIAL HISTORY Social History   Tobacco Use   Smoking status: Never   Smokeless tobacco: Never  Vaping Use   Vaping status: Never Used  Substance Use Topics   Alcohol  use: No   Drug use: No       OPHTHALMIC EXAM:  Not recorded    IMAGING AND PROCEDURES  Imaging and Procedures for 05/06/2024         ASSESSMENT/PLAN: No diagnosis found.  1,2. Retinal  and optic atrophy OD  - referred by Dr. Octavia who reports interval peripapillary RNFL thinning on OCT from Nov 2024 to June 2025 and development of disc pallor OD  - OCT shows diffuse retinal atrophy, exam shows 2-3+ disc pallor OD  - MRI ordered by Dr. Octavia -- scheduled for July 3  - discussed findings and possible etiologies -- suspect side effect of radiation exposure for skin/ear melanoma tx in April 2025 +/- ischemia  - FA with possibly delayed filling time OD  - recommend checking carotid dopplers -- will discuss with Dr. Octavia - no retinal or ophthalmic interventions indicated or recommended for atrophy  - f/u 3-4 months, DFE, OCT  3-5. Proliferative diabetic retinopathy OS        Moderate nonproliferative diabetic retinopathy OD - The incidence, risk factors for progression, natural history and treatment options for diabetic retinopathy were discussed with patient.   - The need for close monitoring of blood glucose, blood pressure, and serum lipids, avoiding cigarette or any type of tobacco, and the need for long term follow up was also discussed with patient. - exam shows mild MA/DBH OU (OS > OD) - FA today (06.24.25) shows late-leaking MA OU, vascular nonperfusion, +NV OS; no NV OD -- pt would benefit from PRP OS - OCT without diabetic macular edema, both eyes  - discussed findings and prognosis - f/u in 2-4 wks for PRP OS  6,7. Hypertensive retinopathy OU - discussed importance of tight BP control - monitor  8. Pseudophakia OU  - s/p CE/IOL (Dr. Octavia, Oct / Nov. 2024)  - IOL in good position, doing well  - monitor   Ophthalmic Meds Ordered this visit:  No orders of the defined types were placed in this encounter.    No follow-ups on file.  There are no Patient Instructions on file for this visit.  This document serves as a record of services personally performed by Redell JUDITHANN Hans, MD, PhD. It was created on their behalf by Delon Newness COT, an ophthalmic  technician. The creation of this record is the provider's dictation and/or activities during the visit.    Electronically signed by: Delon Newness COT 10.22.25  9:49 AM   Abbreviations: M myopia (nearsighted); A astigmatism; H hyperopia (farsighted); P presbyopia; Mrx spectacle prescription;  CTL contact lenses; OD right eye; OS left eye; OU both eyes  XT exotropia; ET esotropia; PEK punctate epithelial keratitis; PEE punctate epithelial erosions; DES dry eye syndrome; MGD meibomian gland dysfunction; ATs artificial tears; PFAT's preservative free artificial tears; NSC nuclear sclerotic cataract;  PSC posterior subcapsular cataract; ERM epi-retinal membrane; PVD posterior vitreous detachment; RD retinal detachment; DM diabetes mellitus; DR diabetic retinopathy; NPDR non-proliferative diabetic retinopathy; PDR proliferative diabetic retinopathy; CSME clinically significant macular edema; DME diabetic macular edema; dbh dot blot hemorrhages; CWS cotton wool spot; POAG primary open angle glaucoma; C/D cup-to-disc ratio; HVF humphrey visual field; GVF goldmann visual field; OCT optical coherence tomography; IOP intraocular pressure; BRVO Branch retinal vein occlusion; CRVO central retinal vein occlusion; CRAO central retinal artery occlusion; BRAO branch retinal artery occlusion; RT retinal tear; SB scleral buckle; PPV pars plana vitrectomy; VH Vitreous hemorrhage; PRP panretinal laser photocoagulation; IVK intravitreal kenalog; VMT vitreomacular traction; MH Macular hole;  NVD neovascularization of the disc; NVE neovascularization elsewhere; AREDS age related eye disease study; ARMD age related macular degeneration; POAG primary open angle glaucoma; EBMD epithelial/anterior basement membrane dystrophy; ACIOL anterior chamber intraocular lens; IOL intraocular lens; PCIOL posterior chamber intraocular lens; Phaco/IOL phacoemulsification with intraocular lens placement; PRK photorefractive keratectomy; LASIK  laser assisted in situ keratomileusis; HTN hypertension; DM diabetes mellitus; COPD chronic obstructive pulmonary disease

## 2024-05-06 ENCOUNTER — Encounter (INDEPENDENT_AMBULATORY_CARE_PROVIDER_SITE_OTHER): Payer: Self-pay

## 2024-05-06 ENCOUNTER — Encounter (INDEPENDENT_AMBULATORY_CARE_PROVIDER_SITE_OTHER): Admitting: Ophthalmology

## 2024-05-06 DIAGNOSIS — E113391 Type 2 diabetes mellitus with moderate nonproliferative diabetic retinopathy without macular edema, right eye: Secondary | ICD-10-CM

## 2024-05-06 DIAGNOSIS — Z794 Long term (current) use of insulin: Secondary | ICD-10-CM

## 2024-05-06 DIAGNOSIS — H3589 Other specified retinal disorders: Secondary | ICD-10-CM

## 2024-05-06 DIAGNOSIS — Z961 Presence of intraocular lens: Secondary | ICD-10-CM

## 2024-05-06 DIAGNOSIS — E113592 Type 2 diabetes mellitus with proliferative diabetic retinopathy without macular edema, left eye: Secondary | ICD-10-CM

## 2024-05-06 DIAGNOSIS — H4720 Unspecified optic atrophy: Secondary | ICD-10-CM

## 2024-05-06 DIAGNOSIS — I10 Essential (primary) hypertension: Secondary | ICD-10-CM

## 2024-05-06 DIAGNOSIS — H35033 Hypertensive retinopathy, bilateral: Secondary | ICD-10-CM

## 2024-05-14 DIAGNOSIS — N189 Chronic kidney disease, unspecified: Secondary | ICD-10-CM | POA: Diagnosis not present

## 2024-05-14 DIAGNOSIS — Z7982 Long term (current) use of aspirin: Secondary | ICD-10-CM | POA: Diagnosis not present

## 2024-05-14 DIAGNOSIS — Z794 Long term (current) use of insulin: Secondary | ICD-10-CM | POA: Diagnosis not present

## 2024-05-14 DIAGNOSIS — I129 Hypertensive chronic kidney disease with stage 1 through stage 4 chronic kidney disease, or unspecified chronic kidney disease: Secondary | ICD-10-CM | POA: Diagnosis not present

## 2024-05-14 DIAGNOSIS — E785 Hyperlipidemia, unspecified: Secondary | ICD-10-CM | POA: Diagnosis not present

## 2024-05-14 DIAGNOSIS — E1151 Type 2 diabetes mellitus with diabetic peripheral angiopathy without gangrene: Secondary | ICD-10-CM | POA: Diagnosis not present

## 2024-05-14 DIAGNOSIS — N529 Male erectile dysfunction, unspecified: Secondary | ICD-10-CM | POA: Diagnosis not present

## 2024-05-14 DIAGNOSIS — E1122 Type 2 diabetes mellitus with diabetic chronic kidney disease: Secondary | ICD-10-CM | POA: Diagnosis not present

## 2024-05-14 DIAGNOSIS — E113599 Type 2 diabetes mellitus with proliferative diabetic retinopathy without macular edema, unspecified eye: Secondary | ICD-10-CM | POA: Diagnosis not present
